# Patient Record
Sex: Male | Born: 1940 | Race: Black or African American | Hispanic: No | Marital: Single | Smoking: Current every day smoker
Health system: Southern US, Community
[De-identification: ages and names within clinical notes are randomized; demographics above are authoritative.]

## PROBLEM LIST (undated history)

## (undated) DIAGNOSIS — I10 Essential (primary) hypertension: Secondary | ICD-10-CM

## (undated) DIAGNOSIS — S065X9A Traumatic subdural hemorrhage with loss of consciousness of unspecified duration, initial encounter: Secondary | ICD-10-CM

## (undated) DIAGNOSIS — G629 Polyneuropathy, unspecified: Secondary | ICD-10-CM

## (undated) DIAGNOSIS — S065XAA Traumatic subdural hemorrhage with loss of consciousness status unknown, initial encounter: Secondary | ICD-10-CM

---

## 1997-08-23 ENCOUNTER — Emergency Department (HOSPITAL_COMMUNITY): Admission: EM | Admit: 1997-08-23 | Discharge: 1997-08-23 | Payer: Self-pay | Admitting: Emergency Medicine

## 1997-12-13 ENCOUNTER — Encounter: Admission: RE | Admit: 1997-12-13 | Discharge: 1997-12-13 | Payer: Self-pay | Admitting: Hematology and Oncology

## 1997-12-24 ENCOUNTER — Encounter: Admission: RE | Admit: 1997-12-24 | Discharge: 1997-12-24 | Payer: Self-pay | Admitting: Internal Medicine

## 1998-01-24 ENCOUNTER — Ambulatory Visit (HOSPITAL_COMMUNITY): Admission: RE | Admit: 1998-01-24 | Discharge: 1998-01-24 | Payer: Self-pay | Admitting: Internal Medicine

## 1998-01-24 ENCOUNTER — Encounter: Admission: RE | Admit: 1998-01-24 | Discharge: 1998-01-24 | Payer: Self-pay | Admitting: Internal Medicine

## 1998-04-01 ENCOUNTER — Encounter: Admission: RE | Admit: 1998-04-01 | Discharge: 1998-04-01 | Payer: Self-pay | Admitting: Internal Medicine

## 1998-07-24 ENCOUNTER — Encounter: Admission: RE | Admit: 1998-07-24 | Discharge: 1998-07-24 | Payer: Self-pay | Admitting: Hematology and Oncology

## 1999-11-12 ENCOUNTER — Encounter: Admission: RE | Admit: 1999-11-12 | Discharge: 1999-11-12 | Payer: Self-pay | Admitting: Internal Medicine

## 1999-12-08 ENCOUNTER — Encounter: Admission: RE | Admit: 1999-12-08 | Discharge: 1999-12-08 | Payer: Self-pay | Admitting: Internal Medicine

## 2009-12-06 ENCOUNTER — Emergency Department (HOSPITAL_COMMUNITY): Admission: EM | Admit: 2009-12-06 | Discharge: 2009-12-06 | Payer: Self-pay | Admitting: Emergency Medicine

## 2010-06-05 LAB — URINALYSIS, ROUTINE W REFLEX MICROSCOPIC
Glucose, UA: >1000 mg/dL — AB
Hgb urine dipstick: NEGATIVE
Ketones, ur: NEGATIVE mg/dL
Protein, ur: NEGATIVE mg/dL

## 2010-06-05 LAB — URINE MICROSCOPIC-ADD ON

## 2010-06-12 ENCOUNTER — Emergency Department (HOSPITAL_COMMUNITY)
Admission: EM | Admit: 2010-06-12 | Discharge: 2010-06-12 | Disposition: A | Discharge status: Home / Self Care | Payer: Non-veteran care | Attending: Emergency Medicine | Admitting: Emergency Medicine

## 2010-06-12 ENCOUNTER — Emergency Department (HOSPITAL_COMMUNITY): Payer: Non-veteran care

## 2010-06-12 ENCOUNTER — Encounter (HOSPITAL_COMMUNITY): Payer: Self-pay | Admitting: Radiology

## 2010-06-12 DIAGNOSIS — R1033 Periumbilical pain: Secondary | ICD-10-CM | POA: Insufficient documentation

## 2010-06-12 DIAGNOSIS — R630 Anorexia: Secondary | ICD-10-CM | POA: Insufficient documentation

## 2010-06-12 DIAGNOSIS — Z79899 Other long term (current) drug therapy: Secondary | ICD-10-CM | POA: Insufficient documentation

## 2010-06-12 DIAGNOSIS — N1330 Unspecified hydronephrosis: Secondary | ICD-10-CM | POA: Insufficient documentation

## 2010-06-12 DIAGNOSIS — E119 Type 2 diabetes mellitus without complications: Secondary | ICD-10-CM | POA: Insufficient documentation

## 2010-06-12 DIAGNOSIS — E785 Hyperlipidemia, unspecified: Secondary | ICD-10-CM | POA: Insufficient documentation

## 2010-06-12 DIAGNOSIS — I10 Essential (primary) hypertension: Secondary | ICD-10-CM | POA: Insufficient documentation

## 2010-06-12 DIAGNOSIS — R112 Nausea with vomiting, unspecified: Secondary | ICD-10-CM | POA: Insufficient documentation

## 2010-06-12 DIAGNOSIS — N201 Calculus of ureter: Secondary | ICD-10-CM | POA: Insufficient documentation

## 2010-06-12 HISTORY — DX: Essential (primary) hypertension: I10

## 2010-06-12 LAB — DIFFERENTIAL
Basophils Absolute: 0 10*3/uL (ref 0.0–0.1)
Lymphocytes Relative: 15 % (ref 12–46)
Neutro Abs: 8.6 10*3/uL — ABNORMAL HIGH (ref 1.7–7.7)

## 2010-06-12 LAB — URINALYSIS, ROUTINE W REFLEX MICROSCOPIC
Bilirubin Urine: NEGATIVE
Ketones, ur: 15 mg/dL — AB
Nitrite: NEGATIVE
pH: 7 (ref 5.0–8.0)

## 2010-06-12 LAB — COMPREHENSIVE METABOLIC PANEL
ALT: 22 U/L (ref 0–53)
Alkaline Phosphatase: 59 U/L (ref 39–117)
CO2: 25 mEq/L (ref 19–32)
GFR calc non Af Amer: >60 mL/min (ref >60)
Glucose, Bld: 256 mg/dL — ABNORMAL HIGH (ref 70–99)
Potassium: 4.1 mEq/L (ref 3.5–5.1)
Sodium: 138 mEq/L (ref 135–145)
Total Protein: 7.1 g/dL (ref 6.0–8.3)

## 2010-06-12 LAB — CBC
HCT: 37.2 % — ABNORMAL LOW (ref 39.0–52.0)
Hemoglobin: 12.5 g/dL — ABNORMAL LOW (ref 13.0–17.0)
WBC: 11.2 10*3/uL — ABNORMAL HIGH (ref 4.0–10.5)

## 2010-06-12 LAB — LIPASE, BLOOD: Lipase: 28 U/L (ref 11–59)

## 2010-06-12 LAB — URINE MICROSCOPIC-ADD ON

## 2010-08-20 ENCOUNTER — Emergency Department (HOSPITAL_COMMUNITY)
Admission: EM | Admit: 2010-08-20 | Discharge: 2010-08-20 | Disposition: A | Discharge status: Home / Self Care | Payer: Non-veteran care | Attending: Emergency Medicine | Admitting: Emergency Medicine

## 2010-08-20 DIAGNOSIS — E119 Type 2 diabetes mellitus without complications: Secondary | ICD-10-CM | POA: Insufficient documentation

## 2010-08-20 DIAGNOSIS — Z0000 Encounter for general adult medical examination without abnormal findings: Secondary | ICD-10-CM | POA: Insufficient documentation

## 2010-08-20 DIAGNOSIS — Z794 Long term (current) use of insulin: Secondary | ICD-10-CM | POA: Insufficient documentation

## 2010-08-20 DIAGNOSIS — I10 Essential (primary) hypertension: Secondary | ICD-10-CM | POA: Insufficient documentation

## 2010-08-20 LAB — GLUCOSE, CAPILLARY: Glucose-Capillary: 279 mg/dL — ABNORMAL HIGH (ref 70–99)

## 2012-08-28 ENCOUNTER — Emergency Department (HOSPITAL_COMMUNITY)
Admission: EM | Admit: 2012-08-28 | Discharge: 2012-08-28 | Discharge status: Against Medical Advice | Payer: Non-veteran care | Attending: Emergency Medicine | Admitting: Emergency Medicine

## 2012-08-28 ENCOUNTER — Encounter (HOSPITAL_COMMUNITY): Payer: Self-pay | Admitting: Emergency Medicine

## 2012-08-28 DIAGNOSIS — Z8669 Personal history of other diseases of the nervous system and sense organs: Secondary | ICD-10-CM | POA: Insufficient documentation

## 2012-08-28 DIAGNOSIS — T38801A Poisoning by unspecified hormones and synthetic substitutes, accidental (unintentional), initial encounter: Secondary | ICD-10-CM | POA: Insufficient documentation

## 2012-08-28 DIAGNOSIS — I10 Essential (primary) hypertension: Secondary | ICD-10-CM | POA: Insufficient documentation

## 2012-08-28 DIAGNOSIS — R42 Dizziness and giddiness: Secondary | ICD-10-CM | POA: Insufficient documentation

## 2012-08-28 DIAGNOSIS — E119 Type 2 diabetes mellitus without complications: Secondary | ICD-10-CM | POA: Insufficient documentation

## 2012-08-28 DIAGNOSIS — K089 Disorder of teeth and supporting structures, unspecified: Secondary | ICD-10-CM | POA: Insufficient documentation

## 2012-08-28 DIAGNOSIS — Z79899 Other long term (current) drug therapy: Secondary | ICD-10-CM | POA: Insufficient documentation

## 2012-08-28 DIAGNOSIS — Z794 Long term (current) use of insulin: Secondary | ICD-10-CM | POA: Insufficient documentation

## 2012-08-28 DIAGNOSIS — T383X1A Poisoning by insulin and oral hypoglycemic [antidiabetic] drugs, accidental (unintentional), initial encounter: Secondary | ICD-10-CM | POA: Insufficient documentation

## 2012-08-28 DIAGNOSIS — F17200 Nicotine dependence, unspecified, uncomplicated: Secondary | ICD-10-CM | POA: Insufficient documentation

## 2012-08-28 DIAGNOSIS — Y929 Unspecified place or not applicable: Secondary | ICD-10-CM | POA: Insufficient documentation

## 2012-08-28 DIAGNOSIS — Y9389 Activity, other specified: Secondary | ICD-10-CM | POA: Insufficient documentation

## 2012-08-28 HISTORY — DX: Polyneuropathy, unspecified: G62.9

## 2012-08-28 LAB — POCT I-STAT, CHEM 8
Calcium, Ion: 1.21 mmol/L (ref 1.13–1.30)
Glucose, Bld: 149 mg/dL — ABNORMAL HIGH (ref 70–99)
HCT: 41 % (ref 39.0–52.0)
Hemoglobin: 13.9 g/dL (ref 13.0–17.0)
Potassium: 3.5 mEq/L (ref 3.5–5.1)
TCO2: 26 mmol/L (ref 0–100)

## 2012-08-28 LAB — GLUCOSE, CAPILLARY
Glucose-Capillary: 283 mg/dL — ABNORMAL HIGH (ref 70–99)
Glucose-Capillary: 169 mg/dL — ABNORMAL HIGH (ref 70–99)

## 2012-08-28 MED ORDER — ASPIRIN 325 MG PO TABS
650.0000 mg | ORAL_TABLET | Freq: Once | ORAL | Status: AC
  Administered 2012-08-28: 650 mg via ORAL
  Filled 2012-08-28: qty 2

## 2012-08-28 NOTE — ED Notes (Signed)
Pt gave himself 44 units of fast acting novlin insulin this was a mistake he was supposed to give himself 12 units according to his sliding scale. His blood sugar at home was 344. EMS CBG was 322 at approximatly 1300. Pt alert and oriented. No signs or symptoms of low blood sugar at this time.

## 2012-08-28 NOTE — ED Notes (Signed)
[id number]<AV> Expected date:<BR> Expected time:<BR> Means of arrival:<BR> Comments:<BR> Accident overdose on insulin took 44 units instead of 12 cbg is 300.

## 2012-08-28 NOTE — ED Notes (Signed)
cbg 163  

## 2012-08-28 NOTE — ED Notes (Signed)
cbg 150. 

## 2012-08-28 NOTE — ED Notes (Signed)
Offered pt something to eat, but pt refused. Pt states he is concerned about how he will get a ride home and wants to leave. MD aware.

## 2012-08-28 NOTE — ED Notes (Signed)
CBG 283 

## 2012-08-28 NOTE — ED Provider Notes (Signed)
History     CSN: [id number]  Arrival date & time 08/28/12  1311   First MD Initiated Contact with Patient 08/28/12 1315      Chief Complaint  Patient presents with  . Drug Overdose    (Consider location/radiation/quality/duration/timing/severity/associated sxs/prior treatment) HPI Comments: Patient presents with an overdose of his insulin. He has history of diabetes and takes metformin at home. He also takes Lantus 44 units in the morning although he doesn't always take that. He did not take his Lantus today. He says that his sugar was over 300 at home today. He states that when his sugar gets over 300 he takes Novolin 12 units. He states that he accidentally took 44 units of his Novolin instead of the 12 units. He currently feels fine and has no symptoms. He has some mild dizziness but he feels like it's from his wisdom teeth that are painful and have to be extracted soon. He denies any other recent illnesses.   Past Medical History  Diagnosis Date  . Diabetes mellitus   . Hypertension   . Neuropathy     History reviewed. No pertinent past surgical history.  History reviewed. No pertinent family history.  History  Substance Use Topics  . Smoking status: Current Every Day Smoker  . Smokeless tobacco: Not on file  . Alcohol Use: Yes      Review of Systems  Constitutional: Negative for fever, chills, diaphoresis and fatigue.  HENT: Negative for congestion, rhinorrhea and sneezing.   Eyes: Negative.   Respiratory: Negative for cough, chest tightness and shortness of breath.   Cardiovascular: Negative for chest pain and leg swelling.  Gastrointestinal: Negative for nausea, vomiting, abdominal pain, diarrhea and blood in stool.  Genitourinary: Negative for frequency, hematuria, flank pain and difficulty urinating.  Musculoskeletal: Negative for back pain and arthralgias.  Skin: Negative for rash.  Neurological: Positive for light-headedness. Negative for dizziness, speech  difficulty, weakness, numbness and headaches.    Allergies  Review of patient's allergies indicates no known allergies.  Home Medications   Current Outpatient Rx  Name  Route  Sig  Dispense  Refill  . insulin glargine (LANTUS) 100 UNIT/ML injection   Subcutaneous   Inject 44 Units into the skin 2 (two) times daily. Only takes if CBG > 185         . insulin regular (NOVOLIN R,HUMULIN R) 100 units/mL injection   Subcutaneous   Inject 12 Units into the skin 3 (three) times daily before meals. Only takes if CBG > 300.         . metFORMIN (GLUCOPHAGE) 1000 MG tablet   Oral   Take 1,000 mg by mouth 2 (two) times daily with a meal.           BP 127/55  Pulse 66  Temp(Src) 98.2 F (36.8 C) (Oral)  Resp 22  SpO2 99%  Physical Exam  Constitutional: He is oriented to person, place, and time. He appears well-developed and well-nourished.  HENT:  Head: Normocephalic and atraumatic.  Mouth/Throat: Oropharynx is clear and moist.  Eyes: Pupils are equal, round, and reactive to light.  Neck: Normal range of motion. Neck supple.  Cardiovascular: Normal rate, regular rhythm and normal heart sounds.   Pulmonary/Chest: Effort normal and breath sounds normal. No respiratory distress. He has no wheezes. He has no rales. He exhibits no tenderness.  Abdominal: Soft. Bowel sounds are normal. There is no tenderness. There is no rebound and no guarding.  Musculoskeletal: Normal range of  motion. He exhibits no edema.  Lymphadenopathy:    He has no cervical adenopathy.  Neurological: He is alert and oriented to person, place, and time.  Skin: Skin is warm and dry. No rash noted.  Psychiatric: He has a normal mood and affect.    ED Course  Procedures (including critical care time)  Results for orders placed during the hospital encounter of 08/28/12  GLUCOSE, CAPILLARY      Result Value Range   Glucose-Capillary 283 (*) 70 - 99 mg/dL  GLUCOSE, CAPILLARY      Result Value Range    Glucose-Capillary 169 (*) 70 - 99 mg/dL  GLUCOSE, CAPILLARY      Result Value Range   Glucose-Capillary 150 (*) 70 - 99 mg/dL  POCT I-STAT, CHEM 8      Result Value Range   Sodium 141  135 - 145 mEq/L   Potassium 3.5  3.5 - 5.1 mEq/L   Chloride 105  96 - 112 mEq/L   BUN 12  6 - 23 mg/dL   Creatinine, Ser 5.40  0.50 - 1.35 mg/dL   Glucose, Bld 981 (*) 70 - 99 mg/dL   Calcium, Ion 1.91  4.78 - 1.30 mmol/L   TCO2 26  0 - 100 mmol/L   Hemoglobin 13.9  13.0 - 17.0 g/dL   HCT 29.5  62.1 - 30.8 %   No results found.   1. Insulin overdose, initial encounter       MDM  Plan is to monitor blood sugar, feed patient.  PT will need a few hours of observation to make sure that CBGs are stable.  Will turn over to Dr Radford Pax        Rolan Bucco, MD 08/28/12 1540

## 2012-08-28 NOTE — ED Notes (Signed)
Lab tech at bedside to draw i-stat chem 8.

## 2012-08-28 NOTE — ED Notes (Signed)
Pt resting quietly. No complaints at this time. Pt states he feels fine. VSS.

## 2012-08-28 NOTE — ED Notes (Signed)
CBG - 85 mg/dl 

## 2012-08-28 NOTE — ED Notes (Signed)
Pt states he wants to leave and take care of his blood sugar. Pt offered food and orange juice, but initially pt refused. Dr. Radford Pax aware. Dr. Radford Pax speaking with pt and is aware that pt wants to leave AMA.

## 2013-09-06 ENCOUNTER — Encounter (HOSPITAL_COMMUNITY): Payer: Self-pay | Admitting: Emergency Medicine

## 2013-09-06 ENCOUNTER — Emergency Department (HOSPITAL_COMMUNITY): Payer: Medicare Other

## 2013-09-06 ENCOUNTER — Emergency Department (HOSPITAL_COMMUNITY)
Admission: EM | Admit: 2013-09-06 | Discharge: 2013-09-06 | Disposition: A | Discharge status: Home / Self Care | Payer: Medicare Other | Attending: Emergency Medicine | Admitting: Emergency Medicine

## 2013-09-06 DIAGNOSIS — S02839A Fracture of medial orbital wall, unspecified side, initial encounter for closed fracture: Secondary | ICD-10-CM

## 2013-09-06 DIAGNOSIS — I10 Essential (primary) hypertension: Secondary | ICD-10-CM | POA: Insufficient documentation

## 2013-09-06 DIAGNOSIS — F17200 Nicotine dependence, unspecified, uncomplicated: Secondary | ICD-10-CM | POA: Insufficient documentation

## 2013-09-06 DIAGNOSIS — Z8669 Personal history of other diseases of the nervous system and sense organs: Secondary | ICD-10-CM | POA: Insufficient documentation

## 2013-09-06 DIAGNOSIS — Z7982 Long term (current) use of aspirin: Secondary | ICD-10-CM | POA: Insufficient documentation

## 2013-09-06 DIAGNOSIS — E119 Type 2 diabetes mellitus without complications: Secondary | ICD-10-CM | POA: Insufficient documentation

## 2013-09-06 DIAGNOSIS — S0230XA Fracture of orbital floor, unspecified side, initial encounter for closed fracture: Secondary | ICD-10-CM | POA: Insufficient documentation

## 2013-09-06 DIAGNOSIS — S0232XA Fracture of orbital floor, left side, initial encounter for closed fracture: Secondary | ICD-10-CM

## 2013-09-06 DIAGNOSIS — Z794 Long term (current) use of insulin: Secondary | ICD-10-CM | POA: Insufficient documentation

## 2013-09-06 DIAGNOSIS — Z79899 Other long term (current) drug therapy: Secondary | ICD-10-CM | POA: Insufficient documentation

## 2013-09-06 LAB — I-STAT CHEM 8, ED
BUN: 12 mg/dL (ref 6–23)
CHLORIDE: 105 meq/L (ref 96–112)
Calcium, Ion: 1.2 mmol/L (ref 1.13–1.30)
Creatinine, Ser: 0.7 mg/dL (ref 0.50–1.35)
Glucose, Bld: 197 mg/dL — ABNORMAL HIGH (ref 70–99)
HEMATOCRIT: 48 % (ref 39.0–52.0)
Hemoglobin: 16.3 g/dL (ref 13.0–17.0)
POTASSIUM: 3.4 meq/L — AB (ref 3.7–5.3)
Sodium: 139 mEq/L (ref 137–147)
TCO2: 25 mmol/L (ref 0–100)

## 2013-09-06 MED ORDER — IOHEXOL 350 MG/ML SOLN
50.0000 mL | Freq: Once | INTRAVENOUS | Status: AC | PRN
  Administered 2013-09-06: 50 mL via INTRAVENOUS

## 2013-09-06 MED ORDER — ONDANSETRON 4 MG PO TBDP
4.0000 mg | ORAL_TABLET | Freq: Once | ORAL | Status: AC
  Administered 2013-09-06: 4 mg via ORAL
  Filled 2013-09-06: qty 1

## 2013-09-06 MED ORDER — ONDANSETRON 4 MG PO TBDP: 4.0000 mg | ORAL_TABLET | Freq: Four times a day (QID) | ORAL | Status: DC | PRN

## 2013-09-06 MED ORDER — CEPHALEXIN 500 MG PO CAPS: 500.0000 mg | ORAL_CAPSULE | Freq: Two times a day (BID) | ORAL | Status: DC

## 2013-09-06 MED ORDER — OXYCODONE-ACETAMINOPHEN 5-325 MG PO TABS: 1.0000 | ORAL_TABLET | ORAL | Status: DC | PRN

## 2013-09-06 MED ORDER — SODIUM CHLORIDE 0.9 % IV BOLUS (SEPSIS)
1000.0000 mL | Freq: Once | INTRAVENOUS | Status: AC
  Administered 2013-09-06: 1000 mL via INTRAVENOUS

## 2013-09-06 MED ORDER — OXYCODONE-ACETAMINOPHEN 5-325 MG PO TABS
1.0000 | ORAL_TABLET | Freq: Once | ORAL | Status: AC
  Administered 2013-09-06: 1 via ORAL
  Filled 2013-09-06: qty 1
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
## 2013-09-06 NOTE — Discharge Instructions (Signed)
Do not blow your nose for the next 2 weeks. Apply ice to the affected swollen areas. If you have any worsening symptoms return to the ER for evaluation. Otherwise call tomorrow for your 2 appointments. Is important to followup with the high doctor tomorrow. You're to follow up with the ear nose and throat doctor in 7-10 days.   Head Injury You have received a head injury. It does not appear serious at this time. Headaches and vomiting are common following head injury. It should be easy to awaken from sleeping. Sometimes it is necessary for you to stay in the emergency department for a while for observation. Sometimes admission to the hospital may be needed. After injuries such as yours, most problems occur within the first 24 hours, but side effects may occur up to 7-10 days after the injury. It is important for you to carefully monitor your condition and contact your health care provider or seek immediate medical care if there is a change in your condition. WHAT ARE THE TYPES OF HEAD INJURIES? Head injuries can be as minor as a bump. Some head injuries can be more severe. More severe head injuries include:  A jarring injury to the brain (concussion).  A bruise of the brain (contusion). This mean there is bleeding in the brain that can cause swelling.  A cracked skull (skull fracture).  Bleeding in the brain that collects, clots, and forms a bump (hematoma). WHAT CAUSES A HEAD INJURY? A serious head injury is most likely to happen to someone who is in a car wreck and is not wearing a seat belt. Other causes of major head injuries include bicycle or motorcycle accidents, sports injuries, and falls. HOW ARE HEAD INJURIES DIAGNOSED? A complete history of the event leading to the injury and your current symptoms will be helpful in diagnosing head injuries. Many times, pictures of the brain, such as CT or MRI are needed to see the extent of the injury. Often, an overnight hospital stay is necessary for  observation.  WHEN SHOULD I SEEK IMMEDIATE MEDICAL CARE?  You should get help right away if:  You have confusion or drowsiness.  You feel sick to your stomach (nauseous) or have continued, forceful vomiting.  You have dizziness or unsteadiness that is getting worse.  You have severe, continued headaches not relieved by medicine. Only take over-the-counter or prescription medicines for pain, fever, or discomfort as directed by your health care provider.  You do not have normal function of the arms or legs or are unable to walk.  You notice changes in the black spots in the center of the colored part of your eye (pupil).  You have a clear or bloody fluid coming from your nose or ears.  You have a loss of vision. During the next 24 hours after the injury, you must stay with someone who can watch you for the warning signs. This person should contact local emergency services (911 in the [place] if you have seizures, you become unconscious, or you are unable to wake up. HOW CAN I PREVENT A HEAD INJURY IN THE FUTURE? The most important factor for preventing major head injuries is avoiding motor vehicle accidents. To minimize the potential for damage to your head, it is crucial to wear seat belts while riding in motor vehicles. Wearing helmets while bike riding and playing collision sports (like football) is also helpful. Also, avoiding dangerous activities around the house will further help reduce your risk of head injury.  WHEN CAN  I RETURN TO NORMAL ACTIVITIES AND ATHLETICS? You should be reevaluated by your health care provider before returning to these activities. If you have any of the following symptoms, you should not return to activities or contact sports until 1 week after the symptoms have stopped:  Persistent headache.  Dizziness or vertigo.  Poor attention and concentration.  Confusion.  Memory problems.  Nausea or vomiting.  Fatigue or tire  easily.  Irritability.  Intolerant of bright lights or loud noises.  Anxiety or depression.  Disturbed sleep. MAKE SURE YOU:   Understand these instructions.  Will watch your condition.  Will get help right away if you are not doing well or get worse. Document Released: 03/09/2005 Document Revised: 03/14/2013 Document Reviewed: 11/14/2012 [facility] Patient Information 2015 [place], [place]. This information is not intended to replace advice given to you by your health care provider. Make sure you discuss any questions you have with your health care provider.   Metformin and X-ray Contrast Studies For some X-ray exams, a contrast dye is used. Contrast dye is a type of medicine used to make the X-ray image clearer. The contrast dye is given to the patient through a vein (intravenously). If you need to have this type of X-ray exam and you take a medication called metformin, your caregiver may have you stop taking metformin before the exam.  LACTIC ACIDOSIS In rare cases, a serious medical condition called lactic acidosis can develop in people who take metformin and receive contrast dye. The following conditions can increase the risk of this complication:   Kidney failure.  Liver problems.  Certain types of heart problems such as:  Heart failure.  Heart attack.  Heart infection.  Heart valve problems.  Alcohol abuse. If left untreated, lactic acidosis can lead to coma.  SYMPTOMS OF LACTIC ACIDOSIS Symptoms of lactic acidosis can include:  Rapid breathing (hyperventilation).  Neurologic symptoms such as:  Headaches.  Confusion.  Dizziness.  Excessive sweating.  Feeling sick to your stomach (nauseous) or throwing up (vomiting). AFTER THE X-RAY EXAM  Stay well-hydrated. Drink fluids as instructed by your caregiver.  If you have a risk of developing lactic acidosis, blood tests may be done to make sure your kidney function is okay.  Metformin is usually stopped  for 48 hours after the X-ray exam. Ask your caregiver when you can start taking metformin again. SEEK MEDICAL CARE IF:   You have shortness of breath or difficulty breathing.  You develop a headache that does not go away.  You have nausea or vomiting.  You urinate more than normal.  You develop a skin rash and have:  Redness.  Swelling.  Itching. Document Released: 02/25/2009 Document Revised: 06/01/2011 Document Reviewed: 02/25/2009 [facility] Patient Information 2015 [place], [place]. This information is not intended to replace advice given to you by your health care provider. Make sure you discuss any questions you have with your health care provider.

## 2013-09-06 NOTE — ED Notes (Signed)
Checked patient eyes patient was 20/70 in both eyes patient was 20/50 right eye patient was 20/100 in left eyes patient does wear glasses
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
## 2013-09-20 ENCOUNTER — Emergency Department (HOSPITAL_COMMUNITY)
Admission: EM | Admit: 2013-09-20 | Discharge: 2013-09-20 | Disposition: A | Discharge status: Home / Self Care | Payer: Non-veteran care | Attending: Emergency Medicine | Admitting: Emergency Medicine

## 2013-09-20 ENCOUNTER — Emergency Department (HOSPITAL_COMMUNITY): Payer: Non-veteran care

## 2013-09-20 ENCOUNTER — Encounter (HOSPITAL_COMMUNITY): Payer: Self-pay | Admitting: Emergency Medicine

## 2013-09-20 DIAGNOSIS — F17200 Nicotine dependence, unspecified, uncomplicated: Secondary | ICD-10-CM | POA: Insufficient documentation

## 2013-09-20 DIAGNOSIS — E119 Type 2 diabetes mellitus without complications: Secondary | ICD-10-CM | POA: Insufficient documentation

## 2013-09-20 DIAGNOSIS — IMO0001 Reserved for inherently not codable concepts without codable children: Secondary | ICD-10-CM | POA: Insufficient documentation

## 2013-09-20 DIAGNOSIS — Z794 Long term (current) use of insulin: Secondary | ICD-10-CM | POA: Insufficient documentation

## 2013-09-20 DIAGNOSIS — Z792 Long term (current) use of antibiotics: Secondary | ICD-10-CM | POA: Insufficient documentation

## 2013-09-20 DIAGNOSIS — I10 Essential (primary) hypertension: Secondary | ICD-10-CM | POA: Insufficient documentation

## 2013-09-20 DIAGNOSIS — R079 Chest pain, unspecified: Secondary | ICD-10-CM | POA: Insufficient documentation

## 2013-09-20 DIAGNOSIS — Z79899 Other long term (current) drug therapy: Secondary | ICD-10-CM | POA: Insufficient documentation

## 2013-09-20 DIAGNOSIS — J0100 Acute maxillary sinusitis, unspecified: Secondary | ICD-10-CM | POA: Insufficient documentation

## 2013-09-20 MED ORDER — AMOXICILLIN-POT CLAVULANATE 875-125 MG PO TABS: 1.0000 | ORAL_TABLET | Freq: Two times a day (BID) | ORAL | Status: DC

## 2013-09-20 MED ORDER — LORATADINE 10 MG PO TABS: 10.0000 mg | ORAL_TABLET | Freq: Every day | ORAL | Status: DC
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
## 2013-09-20 NOTE — ED Notes (Signed)
Patient transported to X-ray 

## 2013-09-20 NOTE — Discharge Instructions (Signed)
Sinusitis Sinusitis is redness, soreness, and swelling (inflammation) of the paranasal sinuses. Paranasal sinuses are air pockets within the bones of your face (beneath the eyes, the middle of the forehead, or above the eyes). In healthy paranasal sinuses, mucus is able to drain out, and air is able to circulate through them by way of your nose. However, when your paranasal sinuses are inflamed, mucus and air can become trapped. This can allow bacteria and other germs to grow and cause infection. Sinusitis can develop quickly and last only a short time (acute) or continue over a long period (chronic). Sinusitis that lasts for more than 12 weeks is considered chronic.  CAUSES  Causes of sinusitis include:  Allergies.  Structural abnormalities, such as displacement of the cartilage that separates your nostrils (deviated septum), which can decrease the air flow through your nose and sinuses and affect sinus drainage.  Functional abnormalities, such as when the small hairs (cilia) that line your sinuses and help remove mucus do not work properly or are not present. SYMPTOMS  Symptoms of acute and chronic sinusitis are the same. The primary symptoms are pain and pressure around the affected sinuses. Other symptoms include:  Upper toothache.  Earache.  Headache.  Bad breath.  Decreased sense of smell and taste.  A cough, which worsens when you are lying flat.  Fatigue.  Fever.  Thick drainage from your nose, which often is green and may contain pus (purulent).  Swelling and warmth over the affected sinuses. DIAGNOSIS  Your caregiver will perform a physical exam. During the exam, your caregiver may:  Look in your nose for signs of abnormal growths in your nostrils (nasal polyps).  Tap over the affected sinus to check for signs of infection.  View the inside of your sinuses (endoscopy) with a special imaging device with a light attached (endoscope), which is inserted into your  sinuses. If your caregiver suspects that you have chronic sinusitis, one or more of the following tests may be recommended:  Allergy tests.  Nasal culture--A sample of mucus is taken from your nose and sent to a lab and screened for bacteria.  Nasal cytology--A sample of mucus is taken from your nose and examined by your caregiver to determine if your sinusitis is related to an allergy. TREATMENT  Most cases of acute sinusitis are related to a viral infection and will resolve on their own within 10 days. Sometimes medicines are prescribed to help relieve symptoms (pain medicine, decongestants, nasal steroid sprays, or saline sprays).  However, for sinusitis related to a bacterial infection, your caregiver will prescribe antibiotic medicines. These are medicines that will help kill the bacteria causing the infection.  Rarely, sinusitis is caused by a fungal infection. In theses cases, your caregiver will prescribe antifungal medicine. For some cases of chronic sinusitis, surgery is needed. Generally, these are cases in which sinusitis recurs more than 3 times per year, despite other treatments. HOME CARE INSTRUCTIONS   Drink plenty of water. Water helps thin the mucus so your sinuses can drain more easily.  Use a humidifier.  Inhale steam 3 to 4 times a day (for example, sit in the bathroom with the shower running).  Apply a warm, moist washcloth to your face 3 to 4 times a day, or as directed by your caregiver.  Use saline nasal sprays to help moisten and clean your sinuses.  Take over-the-counter or prescription medicines for pain, discomfort, or fever only as directed by your caregiver. SEEK IMMEDIATE MEDICAL CARE IF:    You have increasing pain or severe headaches.  You have nausea, vomiting, or drowsiness.  You have swelling around your face.  You have vision problems.  You have a stiff neck.  You have difficulty breathing. MAKE SURE YOU:   Understand these  instructions.  Will watch your condition.  Will get help right away if you are not doing well or get worse. Document Released: 03/09/2005 Document Revised: 06/01/2011 Document Reviewed: 03/24/2011 Exit[employer] Patient Information 2015 [employer]. This information is not intended to replace advice given to you by your health care provider. Make sure you discuss any questions you have with your health care provider.  
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
## 2013-10-14 ENCOUNTER — Encounter (HOSPITAL_COMMUNITY): Payer: Self-pay | Admitting: Emergency Medicine

## 2013-10-14 ENCOUNTER — Emergency Department (HOSPITAL_COMMUNITY): Payer: Medicare Other

## 2013-10-14 ENCOUNTER — Inpatient Hospital Stay (HOSPITAL_COMMUNITY)
Admission: EM | Admit: 2013-10-14 | Discharge: 2013-10-30 | DRG: 026 | Disposition: A | Discharge status: Skilled Nursing Facility | Payer: Medicare Other | Attending: Neurosurgery | Admitting: Neurosurgery

## 2013-10-14 DIAGNOSIS — S065X9A Traumatic subdural hemorrhage with loss of consciousness of unspecified duration, initial encounter: Principal | ICD-10-CM | POA: Diagnosis present

## 2013-10-14 DIAGNOSIS — I10 Essential (primary) hypertension: Secondary | ICD-10-CM | POA: Diagnosis present

## 2013-10-14 DIAGNOSIS — Z91018 Allergy to other foods: Secondary | ICD-10-CM | POA: Diagnosis not present

## 2013-10-14 DIAGNOSIS — E876 Hypokalemia: Secondary | ICD-10-CM | POA: Diagnosis not present

## 2013-10-14 DIAGNOSIS — F1010 Alcohol abuse, uncomplicated: Secondary | ICD-10-CM | POA: Diagnosis present

## 2013-10-14 DIAGNOSIS — E119 Type 2 diabetes mellitus without complications: Secondary | ICD-10-CM | POA: Diagnosis present

## 2013-10-14 DIAGNOSIS — G589 Mononeuropathy, unspecified: Secondary | ICD-10-CM | POA: Diagnosis present

## 2013-10-14 DIAGNOSIS — G8190 Hemiplegia, unspecified affecting unspecified side: Secondary | ICD-10-CM | POA: Diagnosis present

## 2013-10-14 DIAGNOSIS — R29810 Facial weakness: Secondary | ICD-10-CM | POA: Diagnosis present

## 2013-10-14 DIAGNOSIS — R4182 Altered mental status, unspecified: Secondary | ICD-10-CM | POA: Diagnosis present

## 2013-10-14 DIAGNOSIS — Z9181 History of falling: Secondary | ICD-10-CM | POA: Diagnosis not present

## 2013-10-14 DIAGNOSIS — Z7982 Long term (current) use of aspirin: Secondary | ICD-10-CM | POA: Diagnosis not present

## 2013-10-14 DIAGNOSIS — F17200 Nicotine dependence, unspecified, uncomplicated: Secondary | ICD-10-CM | POA: Diagnosis present

## 2013-10-14 DIAGNOSIS — Z794 Long term (current) use of insulin: Secondary | ICD-10-CM | POA: Diagnosis not present

## 2013-10-14 DIAGNOSIS — S065XAA Traumatic subdural hemorrhage with loss of consciousness status unknown, initial encounter: Principal | ICD-10-CM | POA: Diagnosis present

## 2013-10-14 LAB — COMPREHENSIVE METABOLIC PANEL
ALBUMIN: 3.4 g/dL — AB (ref 3.5–5.2)
ALT: 13 U/L (ref 0–53)
ANION GAP: 15 (ref 5–15)
AST: 22 U/L (ref 0–37)
Alkaline Phosphatase: 72 U/L (ref 39–117)
BILIRUBIN TOTAL: 0.8 mg/dL (ref 0.3–1.2)
BUN: 9 mg/dL (ref 6–23)
CO2: 23 meq/L (ref 19–32)
CREATININE: 0.74 mg/dL (ref 0.50–1.35)
Calcium: 8.8 mg/dL (ref 8.4–10.5)
Chloride: 102 mEq/L (ref 96–112)
GFR calc Af Amer: >90 mL/min (ref >90)
GFR calc non Af Amer: 89 mL/min — ABNORMAL LOW (ref >90)
Glucose, Bld: 156 mg/dL — ABNORMAL HIGH (ref 70–99)
Potassium: 3.5 mEq/L — ABNORMAL LOW (ref 3.7–5.3)
Sodium: 140 mEq/L (ref 137–147)
Total Protein: 6.6 g/dL (ref 6.0–8.3)

## 2013-10-14 LAB — I-STAT CHEM 8, ED
BUN: 8 mg/dL (ref 6–23)
CALCIUM ION: 1.14 mmol/L (ref 1.13–1.30)
Chloride: 103 mEq/L (ref 96–112)
Creatinine, Ser: 0.8 mg/dL (ref 0.50–1.35)
Glucose, Bld: 159 mg/dL — ABNORMAL HIGH (ref 70–99)
HCT: 44 % (ref 39.0–52.0)
HEMOGLOBIN: 15 g/dL (ref 13.0–17.0)
Potassium: 3.4 mEq/L — ABNORMAL LOW (ref 3.7–5.3)
Sodium: 140 mEq/L (ref 137–147)
TCO2: 22 mmol/L (ref 0–100)

## 2013-10-14 LAB — CBC
HEMATOCRIT: 41.6 % (ref 39.0–52.0)
HEMOGLOBIN: 13.4 g/dL (ref 13.0–17.0)
MCH: 27.6 pg (ref 26.0–34.0)
MCHC: 32.2 g/dL (ref 30.0–36.0)
MCV: 85.8 fL (ref 78.0–100.0)
Platelets: 257 10*3/uL (ref 150–400)
RBC: 4.85 MIL/uL (ref 4.22–5.81)
RDW: 14.5 % (ref 11.5–15.5)
WBC: 6.6 10*3/uL (ref 4.0–10.5)

## 2013-10-14 LAB — DIFFERENTIAL
BASOS PCT: 0 % (ref 0–1)
Basophils Absolute: 0 10*3/uL (ref 0.0–0.1)
Eosinophils Absolute: 0.2 10*3/uL (ref 0.0–0.7)
Eosinophils Relative: 3 % (ref 0–5)
Lymphocytes Relative: 32 % (ref 12–46)
Lymphs Abs: 2.1 10*3/uL (ref 0.7–4.0)
MONO ABS: 0.7 10*3/uL (ref 0.1–1.0)
MONOS PCT: 10 % (ref 3–12)
NEUTROS ABS: 3.6 10*3/uL (ref 1.7–7.7)
Neutrophils Relative %: 55 % (ref 43–77)

## 2013-10-14 LAB — ETHANOL: Alcohol, Ethyl (B): <11 mg/dL (ref 0–11)

## 2013-10-14 LAB — APTT: APTT: 28 s (ref 24–37)

## 2013-10-14 LAB — PROTIME-INR
INR: 1.03 (ref 0.00–1.49)
Prothrombin Time: 13.5 seconds (ref 11.6–15.2)

## 2013-10-14 LAB — I-STAT TROPONIN, ED: TROPONIN I, POC: 0.01 ng/mL (ref 0.00–0.08)

## 2013-10-14 MED ORDER — LORATADINE 10 MG PO TABS
10.0000 mg | ORAL_TABLET | Freq: Every day | ORAL | Status: DC
Start: 2013-10-15 — End: 2013-10-30
  Administered 2013-10-16 – 2013-10-30 (×14): 10 mg via ORAL
  Filled 2013-10-14 (×16): qty 1

## 2013-10-14 MED ORDER — ACETAMINOPHEN 650 MG RE SUPP: 650.0000 mg | RECTAL | Status: DC | PRN

## 2013-10-14 MED ORDER — INSULIN GLARGINE 100 UNIT/ML ~~LOC~~ SOLN
44.0000 [IU] | Freq: Two times a day (BID) | SUBCUTANEOUS | Status: DC
  Administered 2013-10-16 – 2013-10-17 (×2): 44 [IU] via SUBCUTANEOUS
  Filled 2013-10-14 (×6): qty 0.44

## 2013-10-14 MED ORDER — LABETALOL HCL 5 MG/ML IV SOLN: 10.0000 mg | INTRAVENOUS | Status: DC | PRN

## 2013-10-14 MED ORDER — PANTOPRAZOLE SODIUM 40 MG IV SOLR
40.0000 mg | Freq: Every day | INTRAVENOUS | Status: DC
  Administered 2013-10-15 – 2013-10-16 (×3): 40 mg via INTRAVENOUS
  Filled 2013-10-14 (×4): qty 40

## 2013-10-14 MED ORDER — SENNOSIDES-DOCUSATE SODIUM 8.6-50 MG PO TABS
1.0000 | ORAL_TABLET | Freq: Two times a day (BID) | ORAL | Status: DC
  Administered 2013-10-15 – 2013-10-30 (×29): 1 via ORAL
  Filled 2013-10-14 (×32): qty 1

## 2013-10-14 MED ORDER — STROKE: EARLY STAGES OF RECOVERY BOOK
Freq: Once | Status: AC
  Administered 2013-10-15: 01:00:00
  Filled 2013-10-14: qty 1

## 2013-10-14 MED ORDER — METFORMIN HCL 500 MG PO TABS
1000.0000 mg | ORAL_TABLET | Freq: Two times a day (BID) | ORAL | Status: DC
  Administered 2013-10-16 (×2): 1000 mg via ORAL
  Filled 2013-10-14 (×7): qty 2

## 2013-10-14 MED ORDER — ACETAMINOPHEN-CODEINE #3 300-30 MG PO TABS
1.0000 | ORAL_TABLET | ORAL | Status: DC | PRN
  Administered 2013-10-21: 1 via ORAL
  Filled 2013-10-14: qty 1

## 2013-10-14 MED ORDER — INSULIN ASPART 100 UNIT/ML ~~LOC~~ SOLN
0.0000 [IU] | Freq: Three times a day (TID) | SUBCUTANEOUS | Status: DC
  Administered 2013-10-15 (×2): 2 [IU] via SUBCUTANEOUS
  Administered 2013-10-16 (×2): 3 [IU] via SUBCUTANEOUS
  Administered 2013-10-17: 2 [IU] via SUBCUTANEOUS

## 2013-10-14 MED ORDER — ONDANSETRON 4 MG PO TBDP
4.0000 mg | ORAL_TABLET | Freq: Four times a day (QID) | ORAL | Status: DC | PRN
  Administered 2013-10-15 – 2013-10-29 (×4): 4 mg via ORAL
  Filled 2013-10-14 (×6): qty 1

## 2013-10-14 MED ORDER — ACETAMINOPHEN 325 MG PO TABS
650.0000 mg | ORAL_TABLET | ORAL | Status: DC | PRN
  Administered 2013-10-16 – 2013-10-26 (×4): 650 mg via ORAL
  Filled 2013-10-14 (×4): qty 2

## 2013-10-14 MED ORDER — SODIUM CHLORIDE 0.9 % IV SOLN
INTRAVENOUS | Status: DC
  Administered 2013-10-15 – 2013-10-17 (×4): via INTRAVENOUS
  Administered 2013-10-17 – 2013-10-18 (×2): 100 mL/h via INTRAVENOUS
  Administered 2013-10-19 – 2013-10-20 (×3): via INTRAVENOUS
  Administered 2013-10-20: 100 mL/h via INTRAVENOUS
  Administered 2013-10-21 – 2013-10-24 (×5): via INTRAVENOUS
  Administered 2013-10-24: 100 mL/h via INTRAVENOUS
  Administered 2013-10-25 – 2013-10-28 (×8): via INTRAVENOUS
  Administered 2013-10-29: 1000 mL via INTRAVENOUS
  Administered 2013-10-30: 05:00:00 via INTRAVENOUS
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
## 2013-10-14 NOTE — ED Notes (Signed)
Report given to Sheila, RN
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
## 2013-10-14 NOTE — ED Notes (Signed)
Dr. Belfi at bedside 

## 2013-10-14 NOTE — ED Notes (Signed)
Patient transported to CT 

## 2013-10-15 ENCOUNTER — Encounter (HOSPITAL_COMMUNITY): Payer: Medicare Other | Admitting: Anesthesiology

## 2013-10-15 ENCOUNTER — Inpatient Hospital Stay (HOSPITAL_COMMUNITY): Payer: Medicare Other | Admitting: Anesthesiology

## 2013-10-15 ENCOUNTER — Encounter (HOSPITAL_COMMUNITY)
Admission: EM | Disposition: A | Discharge status: Skilled Nursing Facility | Payer: Self-pay | Source: Home / Self Care | Attending: Neurosurgery

## 2013-10-15 HISTORY — PX: CRANIOTOMY: SHX93

## 2013-10-15 LAB — GLUCOSE, CAPILLARY
GLUCOSE-CAPILLARY: 140 mg/dL — AB (ref 70–99)
GLUCOSE-CAPILLARY: 137 mg/dL — AB (ref 70–99)
Glucose-Capillary: 120 mg/dL — ABNORMAL HIGH (ref 70–99)
Glucose-Capillary: 140 mg/dL — ABNORMAL HIGH (ref 70–99)

## 2013-10-15 LAB — POCT I-STAT 4, (NA,K, GLUC, HGB,HCT)
GLUCOSE: 103 mg/dL — AB (ref 70–99)
HCT: 39 % (ref 39.0–52.0)
Hemoglobin: 13.3 g/dL (ref 13.0–17.0)
Potassium: 3 mEq/L — ABNORMAL LOW (ref 3.7–5.3)
SODIUM: 140 meq/L (ref 137–147)

## 2013-10-15 LAB — MRSA PCR SCREENING: MRSA BY PCR: NEGATIVE

## 2013-10-15 SURGERY — CRANIOTOMY HEMATOMA EVACUATION SUBDURAL
Anesthesia: General | Site: Head | Laterality: Left

## 2013-10-15 MED ORDER — NEOSTIGMINE METHYLSULFATE 10 MG/10ML IV SOLN
INTRAVENOUS | Status: DC | PRN
  Administered 2013-10-15: 2 mg via INTRAVENOUS

## 2013-10-15 MED ORDER — OXYCODONE HCL 5 MG PO TABS: 5.0000 mg | ORAL_TABLET | Freq: Once | ORAL | Status: DC | PRN

## 2013-10-15 MED ORDER — LIDOCAINE HCL (CARDIAC) 20 MG/ML IV SOLN
INTRAVENOUS | Status: DC | PRN
  Administered 2013-10-15: 100 mg via INTRAVENOUS

## 2013-10-15 MED ORDER — NEOSTIGMINE METHYLSULFATE 10 MG/10ML IV SOLN
INTRAVENOUS | Status: AC
  Filled 2013-10-15: qty 1

## 2013-10-15 MED ORDER — SUCCINYLCHOLINE CHLORIDE 20 MG/ML IJ SOLN
INTRAMUSCULAR | Status: AC
  Filled 2013-10-15: qty 1

## 2013-10-15 MED ORDER — ONDANSETRON HCL 4 MG/2ML IJ SOLN
INTRAMUSCULAR | Status: AC
  Filled 2013-10-15: qty 2

## 2013-10-15 MED ORDER — 0.9 % SODIUM CHLORIDE (POUR BTL) OPTIME
TOPICAL | Status: DC | PRN
  Administered 2013-10-15 (×3): 1000 mL

## 2013-10-15 MED ORDER — SODIUM CHLORIDE 0.9 % IV SOLN
INTRAVENOUS | Status: DC | PRN
  Administered 2013-10-15: 12:00:00 via INTRAVENOUS

## 2013-10-15 MED ORDER — OXYCODONE-ACETAMINOPHEN 5-325 MG PO TABS: 1.0000 | ORAL_TABLET | ORAL | Status: DC | PRN

## 2013-10-15 MED ORDER — HYDROMORPHONE HCL PF 1 MG/ML IJ SOLN: 0.2500 mg | INTRAMUSCULAR | Status: DC | PRN

## 2013-10-15 MED ORDER — FENTANYL CITRATE 0.05 MG/ML IJ SOLN
INTRAMUSCULAR | Status: AC
  Filled 2013-10-15: qty 5

## 2013-10-15 MED ORDER — PROPOFOL 10 MG/ML IV BOLUS
INTRAVENOUS | Status: DC | PRN
  Administered 2013-10-15: 100 mg via INTRAVENOUS

## 2013-10-15 MED ORDER — LIDOCAINE-EPINEPHRINE 1 %-1:100000 IJ SOLN
INTRAMUSCULAR | Status: DC | PRN
  Administered 2013-10-15: 10 mL

## 2013-10-15 MED ORDER — ROCURONIUM BROMIDE 100 MG/10ML IV SOLN
INTRAVENOUS | Status: DC | PRN
  Administered 2013-10-15: 50 mg via INTRAVENOUS

## 2013-10-15 MED ORDER — LIDOCAINE HCL (CARDIAC) 20 MG/ML IV SOLN
INTRAVENOUS | Status: AC
  Filled 2013-10-15: qty 5

## 2013-10-15 MED ORDER — HYDRALAZINE HCL 20 MG/ML IJ SOLN
5.0000 mg | INTRAMUSCULAR | Status: DC | PRN
  Administered 2013-10-15: 10 mg via INTRAVENOUS
  Administered 2013-10-18: 20 mg via INTRAVENOUS
  Administered 2013-10-20 – 2013-10-26 (×5): 10 mg via INTRAVENOUS
  Filled 2013-10-15 (×7): qty 1

## 2013-10-15 MED ORDER — HEMOSTATIC AGENTS (NO CHARGE) OPTIME
TOPICAL | Status: DC | PRN
  Administered 2013-10-15: 1 via TOPICAL

## 2013-10-15 MED ORDER — CEFAZOLIN SODIUM-DEXTROSE 2-3 GM-% IV SOLR
2.0000 g | Freq: Once | INTRAVENOUS | Status: AC
  Administered 2013-10-15: 2 g via INTRAVENOUS

## 2013-10-15 MED ORDER — PHENYLEPHRINE 40 MCG/ML (10ML) SYRINGE FOR IV PUSH (FOR BLOOD PRESSURE SUPPORT)
PREFILLED_SYRINGE | INTRAVENOUS | Status: AC
  Filled 2013-10-15: qty 10

## 2013-10-15 MED ORDER — FENTANYL CITRATE 0.05 MG/ML IJ SOLN
INTRAMUSCULAR | Status: DC | PRN
  Administered 2013-10-15 (×2): 50 ug via INTRAVENOUS
  Administered 2013-10-15: 250 ug via INTRAVENOUS
  Administered 2013-10-15: 50 ug via INTRAVENOUS

## 2013-10-15 MED ORDER — SODIUM CHLORIDE 0.9 % IJ SOLN
INTRAMUSCULAR | Status: AC
  Filled 2013-10-15: qty 10

## 2013-10-15 MED ORDER — LABETALOL HCL 5 MG/ML IV SOLN
INTRAVENOUS | Status: DC | PRN
  Administered 2013-10-15: 10 mg via INTRAVENOUS

## 2013-10-15 MED ORDER — BACITRACIN 50000 UNITS IM SOLR
INTRAMUSCULAR | Status: DC | PRN
  Administered 2013-10-15: 14:00:00

## 2013-10-15 MED ORDER — PHENYLEPHRINE HCL 10 MG/ML IJ SOLN
10.0000 mg | INTRAVENOUS | Status: DC | PRN
  Administered 2013-10-15: 40 ug/min via INTRAVENOUS

## 2013-10-15 MED ORDER — EPHEDRINE SULFATE 50 MG/ML IJ SOLN
INTRAMUSCULAR | Status: AC
  Filled 2013-10-15: qty 1

## 2013-10-15 MED ORDER — PROPOFOL 10 MG/ML IV BOLUS
INTRAVENOUS | Status: AC
  Filled 2013-10-15: qty 20

## 2013-10-15 MED ORDER — BUPIVACAINE HCL (PF) 0.5 % IJ SOLN
INTRAMUSCULAR | Status: DC | PRN
  Administered 2013-10-15: 10 mL

## 2013-10-15 MED ORDER — THROMBIN 20000 UNITS EX SOLR
CUTANEOUS | Status: DC | PRN
  Administered 2013-10-15: 14:00:00 via TOPICAL

## 2013-10-15 MED ORDER — GLYCOPYRROLATE 0.2 MG/ML IJ SOLN
INTRAMUSCULAR | Status: DC | PRN
  Administered 2013-10-15: 0.4 mg via INTRAVENOUS

## 2013-10-15 MED ORDER — ONDANSETRON HCL 4 MG/2ML IJ SOLN
INTRAMUSCULAR | Status: DC | PRN
  Administered 2013-10-15: 4 mg via INTRAVENOUS

## 2013-10-15 MED ORDER — MORPHINE SULFATE 2 MG/ML IJ SOLN
2.0000 mg | INTRAMUSCULAR | Status: DC | PRN
  Administered 2013-10-20: 2 mg via INTRAVENOUS
  Filled 2013-10-15: qty 1

## 2013-10-15 MED ORDER — ROCURONIUM BROMIDE 50 MG/5ML IV SOLN
INTRAVENOUS | Status: AC
  Filled 2013-10-15: qty 1

## 2013-10-15 MED ORDER — MEPERIDINE HCL 25 MG/ML IJ SOLN: 6.2500 mg | INTRAMUSCULAR | Status: DC | PRN

## 2013-10-15 MED ORDER — GLYCOPYRROLATE 0.2 MG/ML IJ SOLN
INTRAMUSCULAR | Status: AC
  Filled 2013-10-15: qty 2

## 2013-10-15 MED ORDER — OXYCODONE HCL 5 MG/5ML PO SOLN: 5.0000 mg | Freq: Once | ORAL | Status: DC | PRN

## 2013-10-15 MED ORDER — BACITRACIN ZINC 500 UNIT/GM EX OINT
TOPICAL_OINTMENT | CUTANEOUS | Status: DC | PRN
  Administered 2013-10-15: 1 via TOPICAL

## 2013-10-15 MED ORDER — MORPHINE SULFATE 4 MG/ML IJ SOLN: 4.0000 mg | INTRAMUSCULAR | Status: DC | PRN

## 2013-10-15 MED ORDER — THROMBIN 5000 UNITS EX SOLR
OROMUCOSAL | Status: DC | PRN
  Administered 2013-10-15: 14:00:00 via TOPICAL

## 2013-10-15 MED ORDER — ONDANSETRON HCL 4 MG/2ML IJ SOLN
4.0000 mg | Freq: Once | INTRAMUSCULAR | Status: DC | PRN
  Filled 2013-10-15: qty 2

## 2013-10-15 SURGICAL SUPPLY — 56 items
BANDAGE GAUZE 4  KLING STR (GAUZE/BANDAGES/DRESSINGS) ×4 IMPLANT
BLADE CLIPPER SURG (BLADE) ×2 IMPLANT
BNDG GAUZE ELAST 4 BULKY (GAUZE/BANDAGES/DRESSINGS) ×2 IMPLANT
BRUSH SCRUB EZ PLAIN DRY (MISCELLANEOUS) ×2 IMPLANT
BUR ACORN 6.0 ACORN (BURR) ×1 IMPLANT
BUR ACORN 6.0MM ACORN (BURR) ×1
BUR ROUTER D-58 CRANI (BURR) ×2 IMPLANT
CANISTER SUCT 3000ML (MISCELLANEOUS) ×4 IMPLANT
CONT SPEC 4OZ CLIKSEAL STRL BL (MISCELLANEOUS) ×2 IMPLANT
CORDS BIPOLAR (ELECTRODE) ×2 IMPLANT
COVER LIGHT HANDLE STERIS (MISCELLANEOUS) ×4 IMPLANT
DRAIN SNY WOU 7FLT (WOUND CARE) ×2 IMPLANT
DRAPE NEUROLOGICAL W/INCISE (DRAPES) ×2 IMPLANT
DRAPE WARM FLUID 44X44 (DRAPE) ×2 IMPLANT
DRSG TUBE GAUZE 5/8 (GAUZE/BANDAGES/DRESSINGS) ×2 IMPLANT
DURAPREP 26ML APPLICATOR (WOUND CARE) ×2 IMPLANT
ELECT REM PT RETURN 9FT ADLT (ELECTROSURGICAL) ×3
ELECTRODE REM PT RTRN 9FT ADLT (ELECTROSURGICAL) IMPLANT
EVACUATOR SILICONE 100CC (DRAIN) ×2 IMPLANT
FORCEPS BIPOLAR SPETZLER 8 1.0 (NEUROSURGERY SUPPLIES) ×2 IMPLANT
GLOVE BIO SURGEON STRL SZ7 (GLOVE) ×4 IMPLANT
GLOVE BIOGEL PI IND STRL 7.5 (GLOVE) IMPLANT
GLOVE BIOGEL PI INDICATOR 7.5 (GLOVE) ×4
GLOVE ECLIPSE 7.5 STRL STRAW (GLOVE) ×2 IMPLANT
GLOVE EXAM NITRILE MICROT MD (GLOVE) ×4 IMPLANT
GOWN STRL REUS W/ TWL LRG LVL3 (GOWN DISPOSABLE) IMPLANT
GOWN STRL REUS W/TWL LRG LVL3 (GOWN DISPOSABLE) ×6
HEMOSTAT POWDER KIT SURGIFOAM (HEMOSTASIS) ×2 IMPLANT
HEMOSTAT SURGICEL 2X14 (HEMOSTASIS) ×2 IMPLANT
KIT BASIN OR (CUSTOM PROCEDURE TRAY) ×2 IMPLANT
KIT ROOM TURNOVER OR (KITS) ×2 IMPLANT
MARKER SKIN DUAL TIP RULER LAB (MISCELLANEOUS) ×2 IMPLANT
NDL HYPO 25GX1X1/2 BEV (NEEDLE) IMPLANT
NEEDLE HYPO 25GX1X1/2 BEV (NEEDLE) ×3 IMPLANT
NS IRRIG 1000ML POUR BTL (IV SOLUTION) ×6 IMPLANT
PACK CRANIOTOMY (CUSTOM PROCEDURE TRAY) ×2 IMPLANT
PAD ARMBOARD 7.5X6 YLW CONV (MISCELLANEOUS) ×6 IMPLANT
PENCIL BUTTON HOLSTER BLD 10FT (ELECTRODE) ×2 IMPLANT
PIN MAYFIELD SKULL DISP (PIN) ×2 IMPLANT
PLATE 1.5  2HOLE LNG NEURO (Plate) ×8 IMPLANT
PLATE 1.5 2HOLE LNG NEURO (Plate) IMPLANT
SCREW SELF DRILL HT 1.5/4MM (Screw) ×16 IMPLANT
SCRUB BETADINE 4OZ XXX (MISCELLANEOUS) ×2 IMPLANT
SPONGE GAUZE 4X4 12PLY STER LF (GAUZE/BANDAGES/DRESSINGS) ×2 IMPLANT
STAPLER SKIN PROX WIDE 3.9 (STAPLE) ×2 IMPLANT
STAPLER VISISTAT 35W (STAPLE) ×2 IMPLANT
SUT VIC AB 0 CT1 18XCR BRD 8 (SUTURE) IMPLANT
SUT VIC AB 0 CT1 8-18 (SUTURE) ×3
SUT VIC AB 3-0 SH 8-18 (SUTURE) ×2 IMPLANT
SUT VICRYL 3-0 RB1 18 ABS (SUTURE) ×8 IMPLANT
SYR 20ML ECCENTRIC (SYRINGE) ×2 IMPLANT
TAPE CLOTH 1X10 TAN NS (GAUZE/BANDAGES/DRESSINGS) ×2 IMPLANT
TOWEL OR 17X24 6PK STRL BLUE (TOWEL DISPOSABLE) ×2 IMPLANT
TOWEL OR 17X26 4PK STRL BLUE (TOWEL DISPOSABLE) ×2 IMPLANT
TRAY FOLEY CATH 16FRSI W/METER (SET/KITS/TRAYS/PACK) ×2 IMPLANT
WATER STERILE IRR 1000ML POUR (IV SOLUTION) ×2 IMPLANT

## 2013-10-15 NOTE — Progress Notes (Signed)
Pt seen and examined. No issues overnight. Pt has no new c/o, minimal HA.  EXAM: Temp:  [97.8 F (36.6 C)-98.1 F (36.7 C)] 97.8 F (36.6 C) (07/26 0813) Pulse Rate:  [44-62] 44 (07/26 0700) Resp:  [13-20] 16 (07/26 0700) BP: (142-173)/(55-79) 156/71 mmHg (07/26 0700) SpO2:  [97 %-100 %] 100 % (07/26 0000) Weight:  [57.9 kg (127 lb 10.3 oz)-61.236 kg (135 lb)] 57.9 kg (127 lb 10.3 oz) (07/26 0000) Intake/Output     07/25 0701 - 07/26 0700 07/26 0701 - 07/27 0700   I.V. (mL/kg) 700 (12.1) 100 (1.7)   Total Intake(mL/kg) 700 (12.1) 100 (1.7)   Net +700 +100         Awake, alert, oriented Speech fluent CN grossly intact x slight right facial droop Mild RUE/RLE drift  LABS: Lab Results  Component Value Date   CREATININE 0.80 10/14/2013   BUN 8 10/14/2013   NA 140 10/14/2013   K 3.4* 10/14/2013   CL 103 10/14/2013   CO2 23 10/14/2013   Lab Results  Component Value Date   WBC 6.6 10/14/2013   HGB 15.0 10/14/2013   HCT 44.0 10/14/2013   MCV 85.8 10/14/2013   PLT 257 10/14/2013    IMPRESSION: - 73 y.o. male with large left subacute SDH with mass effect and mild right hemiparesis - Neurologically stable  PLAN: - OR today for left craniotomy for evacuation of SDH  Spoke with patient again about surgery. All his questions were answered.

## 2013-10-15 NOTE — Anesthesia Preprocedure Evaluation (Addendum)
Anesthesia Evaluation  Patient identified by MRN, date of birth, ID band Patient awake    Reviewed: Allergy & Precautions, H&P , NPO status , Patient's Chart, lab work & pertinent test results  Airway Mallampati: I TM Distance: >3 FB Neck ROM: Full    Dental  (+) Missing, Teeth Intact   Pulmonary Current Smoker,          Cardiovascular hypertension, Pt. on medications     Neuro/Psych    GI/Hepatic   Endo/Other  diabetes, Type 2, Oral Hypoglycemic Agents  Renal/GU      Musculoskeletal   Abdominal   Peds  Hematology   Anesthesia Other Findings   Reproductive/Obstetrics                         Anesthesia Physical Anesthesia Plan  ASA: III and emergent  Anesthesia Plan: General   Post-op Pain Management:    Induction: Intravenous  Airway Management Planned: Oral ETT  Additional Equipment: Arterial line  Intra-op Plan:   Post-operative Plan: Extubation in OR  Informed Consent: I have reviewed the patients History and Physical, chart, labs and discussed the procedure including the risks, benefits and alternatives for the proposed anesthesia with the patient or authorized representative who has indicated his/her understanding and acceptance.   Dental advisory given  Plan Discussed with: CRNA, Surgeon and Anesthesiologist  Anesthesia Plan Comments:        Anesthesia Quick Evaluation

## 2013-10-15 NOTE — Transfer of Care (Signed)
Immediate Anesthesia Transfer of Care Note  Patient: Arthur ChuJames E Cegielski  Procedure(s) Performed: Procedure(s): CRANIOTOMY HEMATOMA EVACUATION SUBDURAL (Left)  Patient Location: PACU  Anesthesia Type:General  Level of Consciousness: sedated  Airway & Oxygen Therapy: Patient Spontanous Breathing and Patient connected to nasal cannula oxygen  Post-op Assessment: Report given to PACU RN and Post -op Vital signs reviewed and stable  Post vital signs: Reviewed and stable  Complications: No apparent anesthesia complications

## 2013-10-15 NOTE — Anesthesia Postprocedure Evaluation (Signed)
Anesthesia Post Note  Patient: Arthur Bailey  Procedure(s) Performed: Procedure(s) (LRB): CRANIOTOMY HEMATOMA EVACUATION SUBDURAL (Left)  Anesthesia type: general  Patient location: PACU  Post pain: Pain level controlled  Post assessment: Patient's Cardiovascular Status Stable  Last Vitals:  Filed Vitals:   10/15/13 1559  BP: 141/67  Pulse: 58  Temp:   Resp: 23    Post vital signs: Reviewed and stable  Level of consciousness: sedated  Complications: No apparent anesthesia complications

## 2013-10-15 NOTE — Progress Notes (Signed)
Zofran 4 mg IV removed from Pyxis. Orders changed from IV to PO. Medication not given and wasted. Waste witnessed by [employer].Calone, Charity fundraiserN. PO medication received from Pharmacy and given. Will continue to monitor.

## 2013-10-15 NOTE — Anesthesia Procedure Notes (Signed)
Procedure Name: Intubation Date/Time: 10/15/2013 1:13 PM Performed by: Rogelia BogaMUELLER, Foye Damron P Pre-anesthesia Checklist: Patient identified, Emergency Drugs available, Suction available, Patient being monitored and Timeout performed Patient Re-evaluated:Patient Re-evaluated prior to inductionOxygen Delivery Method: Circle system utilized Preoxygenation: Pre-oxygenation with 100% oxygen Intubation Type: IV induction Ventilation: Mask ventilation without difficulty Laryngoscope Size: Mac and 4 Grade View: Grade I Tube type: Oral Tube size: 8.0 mm Number of attempts: 1 Airway Equipment and Method: Stylet Placement Confirmation: ETT inserted through vocal cords under direct vision,  positive ETCO2 and breath sounds checked- equal and bilateral Secured at: 22 cm Tube secured with: Tape Dental Injury: Teeth and Oropharynx as per pre-operative assessment

## 2013-10-15 NOTE — Op Note (Signed)
PREOP DIAGNOSIS: Left frontal subdural hematoma  POSTOP DIAGNOSIS: Same  PROCEDURE: 1. Left frontal craniotomy for evacuation of subdural hematoma  SURGEON: Dr. Lisbeth RenshawNeelesh Asiana Benninger, MD  ASSISTANT: none  ANESTHESIA: General Endotracheal  EBL: 100cc  SPECIMENS: None  DRAINS: None  COMPLICATIONS: None immediate  CONDITION: Hemodynamically stable to PACU  HISTORY: Arthur Bailey is a 73 y.o. male presenting to the emergency department with direct behavior, and found on physical exam to have mild right hemiparesis and facial droop. CT scan demonstrated a large left subacute subdural hematoma with mass effect. Treatment options were discussed in detail with the patient, including craniotomy for evacuation. The risks of the surgery were discussed. After all the patient's questions were answered, verbal and written consent was obtained and placed in the chart.  PROCEDURE IN DETAIL: After informed consent was obtained and witnessed, the patient was brought to the operating room. After induction of general anesthesia, the patient was positioned on the operative table in the supine position. All pressure points were meticulously padded. Skin incision was then marked out and prepped and draped in the usual sterile fashion.  After timeout was conducted, the skin incision was infiltrated with local anesthetic. Skin incision was then made sharply, and Bovie electrocautery was used to dissect the subcutaneous tissue and the galea was incised. Hemostasis was achieved on the skin edges with Raney clips. The skin flap was then elevated and retracted anteriorly. The temporalis muscle was incised. Bur holes were then created and a craniotomy was fashioned and elevated. Hemostasis was then achieved on the dural surface with bipolar electrocautery. The dura was then opened in cruciate fashion using the monopolar cautery.  Chronic subdural fluid was immediately encountered under mild pressure. The subdural  membrane was then cauterized with bipolar electrocautery and the superficial portion of the membrane was resected. The brain was noted to be slightly pulsatile, and the subdural space was irrigated with copious amounts of saline irrigation totaling approximately 2 L. The subdural space was then inspected in all directions, and no further subdural fluid was seen.  Good hemostasis was confirmed on the brain surface as all irrigation ran clear. The dura was then closed using a combination of interrupted and continuous 4-0 Nurolon stitches. A piece of Gelfoam was then placed over the dural surface. The bone was then plated using standard titanium plates and screws, and replaced. Muscle was then closed using interrupted 0 Vicryl stitches, and the galea was closed using interrupted 3-0 Vicryl sutures. The skin was closed using standard surgical skin staples. Sterile dressing was then applied. The patient was then transferred to the stretcher and taken to the postanesthesia care unit in stable hemodynamic condition.  At the end of the case all sponge, needle, and instrument counts were correct.

## 2013-10-16 ENCOUNTER — Inpatient Hospital Stay (HOSPITAL_COMMUNITY): Payer: Medicare Other

## 2013-10-16 LAB — GLUCOSE, CAPILLARY
GLUCOSE-CAPILLARY: 172 mg/dL — AB (ref 70–99)
GLUCOSE-CAPILLARY: 57 mg/dL — AB (ref 70–99)
GLUCOSE-CAPILLARY: 72 mg/dL (ref 70–99)
Glucose-Capillary: 160 mg/dL — ABNORMAL HIGH (ref 70–99)
Glucose-Capillary: 97 mg/dL (ref 70–99)
Glucose-Capillary: 66 mg/dL — ABNORMAL LOW (ref 70–99)
Glucose-Capillary: 114 mg/dL — ABNORMAL HIGH (ref 70–99)

## 2013-10-16 NOTE — Progress Notes (Signed)
Pt seen and examined. No issues overnight. Pt has been somewhat confused but does not report any HA, visual changes.  EXAM: Temp:  [97.6 F (36.4 C)-99.2 F (37.3 C)] 98.9 F (37.2 C) (07/27 1159) Pulse Rate:  [49-76] 58 (07/27 1100) Resp:  [10-25] 16 (07/27 1100) BP: (104-169)/(33-73) 126/53 mmHg (07/27 1100) SpO2:  [94 %-100 %] 97 % (07/27 1100) Arterial Line BP: (145-180)/(46-74) 150/51 mmHg (07/27 1000) Weight:  [60.8 kg (134 lb 0.6 oz)] 60.8 kg (134 lb 0.6 oz) (07/27 0400) Intake/Output     07/26 0701 - 07/27 0700 07/27 0701 - 07/28 0700   P.O. 420    I.V. (mL/kg) 3700 (60.9) 400 (6.6)   Total Intake(mL/kg) 4120 (67.8) 400 (6.6)   Urine (mL/kg/hr) 1815 (1.2) 175 (0.6)   Emesis/NG output 100 (0.1)    Total Output 1915 175   Net +2205 +225        Urine Occurrence 1 x     Awake, alert, oriented to person, reason for hospitalization, not year. Speech fluent Right neglect Mild right hemiparesis Good strength left Headwrap c/d/i  LABS: Lab Results  Component Value Date   CREATININE 0.80 10/14/2013   BUN 8 10/14/2013   NA 140 10/15/2013   K 3.0* 10/15/2013   CL 103 10/14/2013   CO2 23 10/14/2013   Lab Results  Component Value Date   WBC 6.6 10/14/2013   HGB 13.3 10/15/2013   HCT 39.0 10/15/2013   MCV 85.8 10/14/2013   PLT 257 10/14/2013    IMAGING: Postop CT reviewed demonstrates unchanged MLS, subacute SDH appears replaced with isodense fluid, possible CSF/Saline irrigation. Residual dependent subacute/chronic blood seen.  IMPRESSION: - 73 y.o. male s/p crani for chronic SDH, slightly worsened neurologically postop  PLAN: - Cont current supportive care - PT/OT/SLP eval - D/C foley, a-line - Cont keppra

## 2013-10-16 NOTE — Progress Notes (Signed)
UR completed.  Ciclaly Mulcahey, RN BSN MHA CCM Trauma/Neuro ICU Case Manager [phone]  

## 2013-10-16 NOTE — Evaluation (Signed)
Speech Language Pathology Evaluation Patient Details Name: Arthur Bailey MRN: [id number] DOB: July 11, 1940 Today's Date: 10/16/2013 Time: [id number] SLP Time Calculation (min): 25 min  Problem List:  Patient Active Problem List   Diagnosis Date Noted  . SDH (subdural hematoma) 10/14/2013  . Subdural hematoma 10/14/2013   Past Medical History:  Past Medical History  Diagnosis Date  . Diabetes mellitus   . Hypertension   . Neuropathy    Past Surgical History: History reviewed. No pertinent past surgical history. HPI:  Patient is a 73 y.o. male presenting with weakness and altered mental status. CT Head revealed acute to subacute left frontal subdural hematoma measuring up to 2.5 cm, with mass effect on the left frontal lobe and 7 mm left to right midline shift. Pt underwent craniotomy for evacuation 7/26.   Assessment / Plan / Recommendation Clinical Impression  Pt demonstrates mild perseveration and difficulty with complex linguistic tasks, which is likely exacerbated by more pronounced cognitive impairments in the areas of orientation, awareness, sustained attention, right inattention, and basic problem solving. Speech is mild-moderately dysarthric due to imprecise articulation and decreased vocal intensity, and improves with Min-Mod cues for use of speech strategies. Pt will benefit from SLP services to maximize functional communication and cognition.    SLP Assessment  Patient needs continued Speech Lanaguage Pathology Services    Follow Up Recommendations  Inpatient Rehab;24 hour supervision/assistance (PT/OT evals pending)    Frequency and Duration min 2x/week  2 weeks   Pertinent Vitals/Pain n/a   SLP Goals  SLP Goals Potential to Achieve Goals: Good Potential Considerations: Ability to learn/carryover information  SLP Evaluation Prior Functioning  Cognitive/Linguistic Baseline: Information not available   Cognition  Overall Cognitive Status: Impaired/Different  from baseline Arousal/Alertness: Lethargic Orientation Level: Oriented to place;Disoriented to person;Disoriented to time;Disoriented to situation Attention: Sustained Sustained Attention: Impaired Sustained Attention Impairment: Verbal basic;Functional basic Memory: Impaired Memory Impairment: Decreased recall of new information Awareness: Impaired Awareness Impairment: Intellectual impairment;Emergent impairment;Anticipatory impairment Problem Solving: Impaired Problem Solving Impairment: Verbal basic;Functional basic Behaviors: Perseveration Safety/Judgment: Impaired    Comprehension  Auditory Comprehension Overall Auditory Comprehension: Impaired Yes/No Questions: Impaired Basic Biographical Questions: 76-100% accurate Basic Immediate Environment Questions: 75-100% accurate Complex Questions: 50-74% accurate Commands: Impaired One Step Basic Commands: 75-100% accurate Two Step Basic Commands: 50-74% accurate Multistep Basic Commands: 50-74% accurate Conversation: Simple Interfering Components: Attention;Working Chief Strategy Officermemory;Processing speed Reading Comprehension Reading Status: Not tested    Expression Expression Primary Mode of Expression: Verbal Verbal Expression Overall Verbal Expression: Impaired Initiation: No impairment Automatic Speech: Name;Social Response Level of Generative/Spontaneous Verbalization: Sentence Naming: Impairment Confrontation: Within functional limits Divergent: 25-49% accurate Verbal Errors: Perseveration;Not aware of errors Interfering Components: Attention Non-Verbal Means of Communication: Not applicable Written Expression Written Expression: Not tested   Oral / Motor Motor Speech Overall Motor Speech: Impaired Respiration: Within functional limits Phonation: Low vocal intensity Resonance: Within functional limits Articulation: Impaired Level of Impairment: Sentence Intelligibility: Intelligibility reduced Sentence: 50-74%  accurate Motor Planning: Witnin functional limits Motor Speech Errors: Not applicable   GO      Maxcine HamLaura Paiewonsky, M.A. CCC-SLP [phone]  Maxcine Hamaiewonsky, Lexus Shampine 10/16/2013, 5:04 PM

## 2013-10-16 NOTE — Progress Notes (Signed)
Pt's CBG was 57 at 2155.  Pt with no complaints and ate 75% of his dinner earlier tonight.  Immediate repeat CBG was 66.  Pt given 4 oz orange juice at 2200.  Repeat CBG was 72 at 2215.  Pt given another 4 oz orange juice at 2220.  Repeat CBG was 114 at 2235.  MD Wynetta Emery(Cram) notified and ordered to hold tonight's dose of lantus insulin.  Will continue to monitor.

## 2013-10-17 ENCOUNTER — Encounter (HOSPITAL_COMMUNITY): Payer: Self-pay | Admitting: Neurosurgery

## 2013-10-17 DIAGNOSIS — S069X9A Unspecified intracranial injury with loss of consciousness of unspecified duration, initial encounter: Secondary | ICD-10-CM

## 2013-10-17 DIAGNOSIS — S069XAA Unspecified intracranial injury with loss of consciousness status unknown, initial encounter: Secondary | ICD-10-CM

## 2013-10-17 DIAGNOSIS — W19XXXA Unspecified fall, initial encounter: Secondary | ICD-10-CM

## 2013-10-17 LAB — GLUCOSE, CAPILLARY
GLUCOSE-CAPILLARY: 116 mg/dL — AB (ref 70–99)
Glucose-Capillary: 121 mg/dL — ABNORMAL HIGH (ref 70–99)
Glucose-Capillary: 115 mg/dL — ABNORMAL HIGH (ref 70–99)
Glucose-Capillary: 128 mg/dL — ABNORMAL HIGH (ref 70–99)

## 2013-10-17 MED ORDER — INSULIN GLARGINE 100 UNIT/ML ~~LOC~~ SOLN
44.0000 [IU] | Freq: Every day | SUBCUTANEOUS | Status: DC
  Filled 2013-10-17: qty 0.44

## 2013-10-17 MED ORDER — METFORMIN HCL 500 MG PO TABS
750.0000 mg | ORAL_TABLET | Freq: Two times a day (BID) | ORAL | Status: DC
  Filled 2013-10-17 (×2): qty 2

## 2013-10-17 MED ORDER — INSULIN ASPART 100 UNIT/ML ~~LOC~~ SOLN
0.0000 [IU] | Freq: Three times a day (TID) | SUBCUTANEOUS | Status: DC
  Administered 2013-10-18: 3 [IU] via SUBCUTANEOUS
  Administered 2013-10-19 – 2013-10-20 (×2): 2 [IU] via SUBCUTANEOUS
  Administered 2013-10-20: 1 [IU] via SUBCUTANEOUS
  Administered 2013-10-21: 5 [IU] via SUBCUTANEOUS
  Administered 2013-10-21: 1 [IU] via SUBCUTANEOUS
  Administered 2013-10-21 – 2013-10-24 (×8): 2 [IU] via SUBCUTANEOUS
  Administered 2013-10-25: 1 [IU] via SUBCUTANEOUS
  Administered 2013-10-25: 2 [IU] via SUBCUTANEOUS
  Administered 2013-10-25: 3 [IU] via SUBCUTANEOUS
  Administered 2013-10-26: 1 [IU] via SUBCUTANEOUS
  Administered 2013-10-26: 3 [IU] via SUBCUTANEOUS
  Administered 2013-10-26 – 2013-10-27 (×4): 2 [IU] via SUBCUTANEOUS
  Administered 2013-10-28 (×2): 1 [IU] via SUBCUTANEOUS
  Administered 2013-10-28 – 2013-10-30 (×5): 2 [IU] via SUBCUTANEOUS

## 2013-10-17 MED ORDER — PANTOPRAZOLE SODIUM 40 MG PO TBEC
40.0000 mg | DELAYED_RELEASE_TABLET | Freq: Every day | ORAL | Status: DC
  Administered 2013-10-17 – 2013-10-29 (×13): 40 mg via ORAL
  Filled 2013-10-17 (×13): qty 1

## 2013-10-17 NOTE — Progress Notes (Signed)
Pt seen and examined. No issues overnight. Pt continues to deny any complaints, no HA or visual changes  EXAM: Temp:  [98.3 F (36.8 C)-99.8 F (37.7 C)] 98.6 F (37 C) (07/28 0744) Pulse Rate:  [57-85] 60 (07/28 0800) Resp:  [10-30] 22 (07/28 0800) BP: (125-158)/(46-95) 147/55 mmHg (07/28 0800) SpO2:  [95 %-99 %] 97 % (07/28 0800) Arterial Line BP: (150)/(51) 150/51 mmHg (07/27 1000) Weight:  [60.7 kg (133 lb 13.1 oz)] 60.7 kg (133 lb 13.1 oz) (07/28 0400) Intake/Output     07/27 0701 - 07/28 0700 07/28 0701 - 07/29 0700   P.O. 480    I.V. (mL/kg) 2400 (39.5) 100 (1.6)   Total Intake(mL/kg) 2880 (47.4) 100 (1.6)   Urine (mL/kg/hr) 2250 (1.5)    Emesis/NG output     Total Output 2250     Net +630 +100        Urine Occurrence 1 x     Awake, alert, oriented to person, place, reason for hospitalization. Says year is 2012 Does not track to the right Good strength LUE/LLE Increased tone RUE/RLE Significant neglect of right Headwrap in place, c/d/i   LABS: Lab Results  Component Value Date   CREATININE 0.80 10/14/2013   BUN 8 10/14/2013   NA 140 10/15/2013   K 3.0* 10/15/2013   CL 103 10/14/2013   CO2 23 10/14/2013   Lab Results  Component Value Date   WBC 6.6 10/14/2013   HGB 13.3 10/15/2013   HCT 39.0 10/15/2013   MCV 85.8 10/14/2013   PLT 257 10/14/2013   IMPRESSION: - 73 y.o. male POD#2 s/p left crani for chronic SDH with significant postoperative right-sided neglect - Postop CT shows similar local mass effect with some persistent dependent chronic subdural hematoma over left parietal lobe. Similar MLS.  PLAN: - I do not believe repeat craniotomy at this time would be of benefit as the prior surgery has replaced the majority of chronic subdural with isodense fluid without any significant re-expansion of the brain. - Cont supportive care - Decrease metformin to 750mg  BID - PT/OT today, cont SLP therapy

## 2013-10-17 NOTE — Evaluation (Signed)
Physical Therapy Evaluation Patient Details Name: Arthur Bailey MRN: [id number] DOB: 27-Aug-1940 Today's Date: 10/17/2013   History of Present Illness  Patient is a 73 y.o. male presenting with weakness and altered mental status. CT Head revealed acute to subacute left frontal subdural hematoma measuring up to 2.5 cm, with mass effect on the left frontal lobe and 7 mm left to right midline shift. Pt underwent craniotomy for evacuation 7/26.    Clinical Impression  Patient demonstrates deficits in mobility as indicated below. Will need continued skilled PT to address deficits and maximize function. Will see as indicated and progress as tolerated. Recommend CIR upon acute discharge.    Follow Up Recommendations CIR;Supervision/Assistance - 24 hour    Equipment Recommendations   (tbd)    Recommendations for Other Services Rehab consult     Precautions / Restrictions Precautions Precautions: Fall      Mobility  Bed Mobility Overal bed mobility: Needs Assistance;+2 for physical assistance Bed Mobility: Supine to Sit     Supine to sit: Max assist;+2 for physical assistance        Transfers Overall transfer level: Needs assistance Equipment used:  (face to face with gait belt and chuck pad) Transfers: Sit to/from Stand;Stand Pivot Transfers Sit to Stand: Max assist;+2 physical assistance Stand pivot transfers: Max assist;+2 physical assistance       General transfer comment: Patient required assist to elevate to standing, cues for upright posture, patient unable to initiate strides to chair but could take some weight through BLEs  Ambulation/Gait                Stairs            Wheelchair Mobility    Modified Rankin (Stroke Patients Only)       Balance Overall balance assessment: Needs assistance Sitting-balance support: Single extremity supported;Feet supported Sitting balance-Leahy Scale: Poor Sitting balance - Comments: moderate assist  required Postural control: Posterior lean;Right lateral lean Standing balance support: Bilateral upper extremity supported;During functional activity Standing balance-Leahy Scale: Zero                               Pertinent Vitals/Pain No pain indicated    Home Living Family/patient expects to be discharged to:: Private residence Living Arrangements: Alone Available Help at Discharge: Neighbor Type of Home: Apartment Home Access: Stairs to enter Entrance Stairs-Rails: Doctor, general practice of Steps: 5 Home Layout: One level Home Equipment: None      Prior Function Level of Independence: Independent               Hand Dominance   Dominant Hand: Right    Extremity/Trunk Assessment   Upper Extremity Assessment: Defer to OT evaluation;RUE deficits/detail           Lower Extremity Assessment: Generalized weakness;RLE deficits/detail RLE Deficits / Details: significant spasticity noted on evaluation, poor attention to right, some active movement with automatic commands, but unable to perform mvoements in isolation with commands       Communication      Cognition Arousal/Alertness: Awake/alert Behavior During Therapy: WFL for tasks assessed/performed;Flat affect Overall Cognitive Status: Impaired/Different from baseline Area of Impairment: Orientation;Attention;Following commands;Safety/judgement;Awareness;Problem solving Orientation Level: Disoriented to;Time;Situation Current Attention Level: Focused Memory: Decreased short-term memory Following Commands: Follows one step commands inconsistently;Follows one step commands with increased time Safety/Judgement: Decreased awareness of safety;Decreased awareness of deficits Awareness: Intellectual Problem Solving: Slow processing;Decreased initiation;Difficulty sequencing;Requires verbal cues;Requires tactile  cues General Comments: patient demonstrates some neglect and poor awareness to the  right, patient also with inconsitent ability to follow simple commands with L/R discrimination difficulty.    General Comments      Exercises        Assessment/Plan    PT Assessment Patient needs continued PT services  PT Diagnosis Difficulty walking;Abnormality of gait;Generalized weakness;Altered mental status   PT Problem List Decreased strength;Decreased range of motion;Decreased activity tolerance;Decreased balance;Decreased mobility;Decreased coordination;Decreased cognition;Decreased knowledge of use of DME;Decreased safety awareness;Impaired tone  PT Treatment Interventions DME instruction;Gait training;Stair training;Functional mobility training;Therapeutic activities;Therapeutic exercise;Balance training;Patient/family education   PT Goals (Current goals can be found in the Care Plan section) Acute Rehab PT Goals Patient Stated Goal: to go home PT Goal Formulation: With patient Time For Goal Achievement: 10/31/13 Potential to Achieve Goals: Fair    Frequency Min 3X/week   Barriers to discharge Inaccessible home environment;Decreased caregiver support      Co-evaluation               End of Session Equipment Utilized During Treatment: Gait belt Activity Tolerance: Patient tolerated treatment well;Patient limited by fatigue Patient left: in chair;with call bell/phone within reach Nurse Communication: Mobility status         Time: [id number] PT Time Calculation (min): 25 min   Charges:   PT Evaluation $Initial PT Evaluation Tier I: 1 Procedure PT Treatments $Therapeutic Activity: 23-37 mins   PT G CodesFabio Asa:          Kaileb Monsanto J 10/17/2013, 4:34 PM Charlotte Crumbevon Chavon Lucarelli, PT DPT  [phone]
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
## 2013-10-17 NOTE — Progress Notes (Signed)
Inpatient Diabetes Program Recommendations  AACE/ADA: New Consensus Statement on Inpatient Glycemic Control (2013)  Target Ranges:  Prepandial:   less than 140 mg/dL      Peak postprandial:   less than 180 mg/dL (1-2 hours)      Critically ill patients:  140 - 180 mg/dL  Results for Arthur Bailey, Roe E (MRN [id number]) as of 10/17/2013 12:33  Ref. Range 10/16/2013 21:55 10/16/2013 21:56 10/16/2013 22:17 10/16/2013 22:36 10/17/2013 07:44 10/17/2013 12:18  Glucose-Capillary Latest Range: 70-99 mg/dL 57 (L) 66 (L) 72 409114 (H) 121 (H) 115 (H)   Inpatient Diabetes Program Recommendations Insulin - Basal: Lantus 44 units once daily (home dose is 44 units BID per med rec) Correction (SSI): decrease to sensitive scale TID Oral Agents: DC Metformin during hospitalization  This coordinator spoke with RN caring for patient concerning diabetes regimen.  RN is concerned about hypoglycemia patient experienced last evening.  Above recommendations were given to RN to request from MD.  Will follow. Thank you  Piedad ClimesGina Jewel Venditto BSN, RN,CDE Inpatient Diabetes Coordinator [phone] (team pager)

## 2013-10-17 NOTE — Progress Notes (Signed)
OT Cancellation Note  Patient Details Name: Arthur Bailey MRN: [id number] DOB: January 24, 1941   Cancelled Treatment:    Reason Eval/Treat Not Completed: Patient not medically ready. Patient on bedrest.      Galen ManilaSpencer, Marlo Goodrich Jeanette 10/17/2013, 9:47 AM

## 2013-10-17 NOTE — Progress Notes (Signed)
PT Cancellation Note  Patient Details Name: Encarnacion ChuJames E Norgaard MRN: [id number] DOB: 1940-05-30   Cancelled Treatment:    Reason Eval/Treat Not Completed: Patient not medically ready. Patient on bedrest.   Fabio AsaWerner, Jearldean Gutt J 10/17/2013, 8:23 AM Charlotte Crumbevon Azrielle Springsteen, PT DPT  [phone]

## 2013-10-18 LAB — GLUCOSE, CAPILLARY
Glucose-Capillary: 107 mg/dL — ABNORMAL HIGH (ref 70–99)
Glucose-Capillary: 228 mg/dL — ABNORMAL HIGH (ref 70–99)
Glucose-Capillary: 52 mg/dL — ABNORMAL LOW (ref 70–99)
Glucose-Capillary: 56 mg/dL — ABNORMAL LOW (ref 70–99)
Glucose-Capillary: 82 mg/dL (ref 70–99)
Glucose-Capillary: 92 mg/dL (ref 70–99)
Glucose-Capillary: 99 mg/dL (ref 70–99)

## 2013-10-18 MED ORDER — INSULIN GLARGINE 100 UNIT/ML ~~LOC~~ SOLN
35.0000 [IU] | Freq: Every day | SUBCUTANEOUS | Status: DC
  Administered 2013-10-18: 35 [IU] via SUBCUTANEOUS
  Filled 2013-10-18 (×2): qty 0.35

## 2013-10-18 MED ORDER — INSULIN GLARGINE 100 UNIT/ML ~~LOC~~ SOLN: 35.0000 [IU] | Freq: Every day | SUBCUTANEOUS | Status: DC

## 2013-10-18 NOTE — Progress Notes (Signed)
Pt seen and examined. No issues overnight. Pt denies any complaints.  EXAM: Temp:  [97.9 F (36.6 C)-99.6 F (37.6 C)] 97.9 F (36.6 C) (07/29 1637) Pulse Rate:  [53-81] 74 (07/29 1300) Resp:  [19-32] 24 (07/29 1300) BP: (126-181)/(48-94) 163/76 mmHg (07/29 1300) SpO2:  [92 %-98 %] 97 % (07/29 1300) Intake/Output     07/28 0701 - 07/29 0700 07/29 0701 - 07/30 0700   P.O. 600    I.V. (mL/kg) 2400 (39.5) 600 (9.9)   Total Intake(mL/kg) 3000 (49.4) 600 (9.9)   Urine (mL/kg/hr) 2850 (2) 1200 (1.9)   Total Output 2850 1200   Net +150 -600         Awake, alert, oriented to person, place, reason for admission Does not track to right Good strength LUE/LLE No voluntary movement RUE/RLE Wound c/d/i  LABS: Lab Results  Component Value Date   CREATININE 0.80 10/14/2013   BUN 8 10/14/2013   NA 140 10/15/2013   K 3.0* 10/15/2013   CL 103 10/14/2013   CO2 23 10/14/2013   Lab Results  Component Value Date   WBC 6.6 10/14/2013   HGB 13.3 10/15/2013   HCT 39.0 10/15/2013   MCV 85.8 10/14/2013   PLT 257 10/14/2013    IMPRESSION: - 73 y.o. male POD# 3 s/p left crani for chronic SDH with significant postop right neglect - Neglect has not improved since postop  PLAN: - Will repeat CTH again tomorrow am - Cont Keppra - Cont therapy

## 2013-10-18 NOTE — Progress Notes (Signed)
Inpatient Diabetes Program Recommendations  AACE/ADA: New Consensus Statement on Inpatient Glycemic Control (2013)  Target Ranges:  Prepandial:   less than 140 mg/dL      Peak postprandial:   less than 180 mg/dL (1-2 hours)      Critically ill patients:  140 - 180 mg/dL   Recommend decreasing Lantus to 35 units.  Hypoglycemia this morning with CBG=52. Will follow. Thank you  Piedad ClimesGina Nyesha Cliff BSN, RN,CDE Inpatient Diabetes Coordinator [phone] (team pager)

## 2013-10-18 NOTE — Progress Notes (Signed)
Speech Language Pathology Treatment: Cognitive-Linquistic  Patient Details Name: Arthur Bailey MRN: [id number] DOB: 06-03-1940 Today's Date: 10/18/2013 Time: [id number] SLP Time Calculation (min): 16 min  Assessment / Plan / Recommendation Clinical Impression  Pt requires Max cues for basic problem solving to utilize calendar for orientation clues. [facility], disoriented to time and situation. Pt required Mod-Max cues to scan to the right while reading calendar. SLP provided Max cues for intellectual and emergent awareness of cognitive and physical impairments. Will continue to follow.   HPI HPI: Patient is a 73 y.o. male presenting with weakness and altered mental status. CT Head revealed acute to subacute left frontal subdural hematoma measuring up to 2.5 cm, with mass effect on the left frontal lobe and 7 mm left to right midline shift. Pt underwent craniotomy for evacuation 7/26.   Pertinent Vitals n/a  SLP Plan  Continue with current plan of care    Recommendations                Follow up Recommendations: Inpatient Rehab;24 hour supervision/assistance Plan: Continue with current plan of care    GO      Arthur Bailey, M.A. CCC-SLP [phone]  Arthur Bailey, Arthur Bailey 10/18/2013, 4:05 PM

## 2013-10-18 NOTE — Clinical Social Work Placement (Addendum)
Clinical Social Work Department CLINICAL SOCIAL WORK PLACEMENT NOTE 10/18/2013  Patient:  Arthur Bailey,Arthur Bailey  Account Number:  [id number] Admit date:  10/14/2013  Clinical Social Worker:  Macario GoldsJESSE SCINTO, LCSW  Date/time:  10/18/2013 02:00 PM  Clinical Social Work is seeking post-discharge placement for this patient at the following level of care:   SKILLED NURSING   (*CSW will update this form in Epic as items are completed)   10/18/2013  Patient/family provided with [facility] Department of Clinical Social Work's list of facilities offering this level of care within the geographic area requested by the patient (or if unable, by the patient's family).  10/18/2013  Patient/family informed of their freedom to choose among providers that offer the needed level of care, that participate in Medicare, Medicaid or managed care program needed by the patient, have an available bed and are willing to accept the patient.  10/18/2013  Patient/family informed of [facility]' ownership interest in [facility], as well as of the fact that they are under no obligation to receive care at this facility.  PASARR submitted to EDS on 10/18/2013 PASARR number received on 10/18/2013  FL2 transmitted to all facilities in geographic area requested by pt/family on  10/18/2013 FL2 transmitted to all facilities within larger geographic area on   Patient informed that his/her managed care company has contracts with or will negotiate with  certain facilities, including the following:     Patient/family informed of bed offers received:   10/24/2013 [facility] Physician recommends and patient chooses bed at    [facility]  10/[facility]/2013 Patient to be transferred to facility by [facility] Patient and family notified of transfer on 10/30/2013 Name of family member notified:  Pt's POA Leonette Most(Charles Colmer [phone]) notified via phone.  The following  physician request were entered in Epic:   Additional Comments:  Marcelline DeistEmily Dalesha Stanback, MSW, [facility] Licensed Clinical Social Worker [phone] [phone] [id number] [phone]

## 2013-10-18 NOTE — Progress Notes (Signed)
Physical Therapy Treatment Patient Details Name: Arthur Bailey MRN: [id number] DOB: [birth year] Today's Date: 10/18/2013    History of Present Illness Patient is a 73 y.o. male presenting with weakness and altered mental status. CT Head revealed acute to subacute left frontal subdural hematoma measuring up to 2.5 cm, with mass effect on the left frontal lobe and 7 mm left to right midline shift. Pt underwent craniotomy for evacuation 7/26.      PT Comments    Patient with very modest progress, continues to demonstrate no functional use of ride side, poor attention to the right, and difficulty with balance and mobility. Will continue to see as indicated, may need to consider downgrading goals if no progress next session.   Follow Up Recommendations  CIR;Supervision/Assistance - 24 hour     Equipment Recommendations   (tbd)    Recommendations for Other Services Rehab consult     Precautions / Restrictions Precautions Precautions: Fall    Mobility  Bed Mobility Overal bed mobility: Needs Assistance;+2 for physical assistance Bed Mobility: Supine to Sit     Supine to sit: Max assist;+2 for physical assistance        Transfers Overall transfer level: Needs assistance Equipment used:  (face to face with gait belt and chuck pad)   Sit to Stand: Max assist;+2 physical assistance Stand pivot transfers: Max assist;+2 physical assistance       General transfer comment: Patient required assist to elevate to standing, cues for upright posture, patient unable to initiate strides to chair but could take some weight through BLEs  Ambulation/Gait                 Stairs            Wheelchair Mobility    Modified Rankin (Stroke Patients Only)       Balance Overall balance assessment: Needs assistance Sitting-balance support: Single extremity supported Sitting balance-Leahy Scale: Poor Sitting balance - Comments: moderate assist required Postural control:  Posterior lean;Right lateral lean Standing balance support: Bilateral upper extremity supported;During functional activity Standing balance-Leahy Scale: Zero                      Cognition Arousal/Alertness: Awake/alert Behavior During Therapy: WFL for tasks assessed/performed;Flat affect Overall Cognitive Status: Impaired/Different from baseline Area of Impairment: Orientation;Attention;Following commands;Safety/judgement;Awareness;Problem solving Orientation Level: Disoriented to;Time;Situation Current Attention Level: Focused Memory: Decreased short-term memory Following Commands: Follows one step commands inconsistently;Follows one step commands with increased time Safety/Judgement: Decreased awareness of safety;Decreased awareness of deficits Awareness: Intellectual Problem Solving: Slow processing;Decreased initiation;Difficulty sequencing;Requires verbal cues;Requires tactile cues General Comments: patient continues to demonstrate difficulty attending to right side, patient also confused and perseverating on "getting his car"    Exercises      General Comments General comments (skin integrity, edema, etc.): trunk control and facilitation for posture and balance. Reaching activities. Lateral repositioning on elbow to facilitate midline. Trunk flexion extension seated EOB. Attempted LE ROM, no movement noted on right side.       Pertinent Vitals/Pain No pain reported, VSS     Home Living Family/patient expects to be discharged to:: Private residence Living Arrangements: Alone Available Help at Discharge: Neighbor Type of Home: Apartment Home Access: Stairs to enter Entrance Stairs-Rails: Right;Left Home Layout: One level Home Equipment: None      Prior Function Level of Independence: Independent          PT Goals (current goals can now be found in the care  plan section) Acute Rehab PT Goals Patient Stated Goal: to go home PT Goal Formulation: With  patient Time For Goal Achievement: 10/31/13 Potential to Achieve Goals: Fair Progress towards PT goals: Progressing toward goals (modest, pt with no evidence of recovery R sided movement)    Frequency  Min 3X/week    PT Plan      Co-evaluation             End of Session Equipment Utilized During Treatment: Gait belt Activity Tolerance: Patient tolerated treatment well;Patient limited by fatigue Patient left: in chair;with call bell/phone within reach     Time: [id number] PT Time Calculation (min): 27 min  Charges:  $Therapeutic Activity: 23-37 mins                    G CodesFabio Asa:      Arthur Bailey J 10/18/2013, 1:29 PM Charlotte Crumbevon Manly Nestle, PT DPT  [phone]

## 2013-10-18 NOTE — Evaluation (Signed)
Occupational Therapy Evaluation Patient Details Name: Arthur Bailey MRN: [id number] DOB: 26-Feb-1941 Today's Date: 10/18/2013    History of Present Illness Patient is a 73 y.o. male presenting with weakness and altered mental status. CT Head revealed acute to subacute left frontal subdural hematoma measuring up to 2.5 cm, with mass effect on the left frontal lobe and 7 mm left to right midline shift. Pt underwent craniotomy for evacuation 7/26.     Clinical Impression   Pt presents to OT with decreased I with ADL activity due to problems listed below. Pt will benefit form skilled OT to increase I with ADL activity and return to PLOF    Follow Up Recommendations  CIR    Equipment Recommendations  Other (comment) (TBD)    Recommendations for Other Services Rehab consult     Precautions / Restrictions Precautions Precautions: Fall              ADL Overall ADL's : Needs assistance/impaired     Grooming: Wash/dry face;Bed level;Minimal assistance Grooming Details (indicate cue type and reason): using LUe only                               General ADL Comments: OT eval focused on vision, RUE , as well as A ing pt with cell phone which was very important to pt.  Pt is not able to access or find phone numbers on his cell phone.  OT provided VC- but pt not able to follow the steps to find number that OT added to phone for pt (niece- Andora).  Pt was challenged with following directions, as well  as using LUe to naviagate phone.     Vision Eye Alignment: Within Functional Limits Alignment/Gaze Preference: Other (comment) (gaze midline to left)   Tracking/Visual Pursuits: Impaired - to be further tested in functional context (pt did not track at all.  Pt would move eyes to see objects in the room but would not track upon command)   Convergence: Impaired (comment)                Hand Dominance Right   Extremity/Trunk Assessment Upper Extremity  Assessment Upper Extremity Assessment: RUE deficits/detail;LUE deficits/detail RUE Deficits / Details: RUE with increased tone.  Very difficult to perform PROM.  unable to obtain full PROM RUE Coordination: decreased fine motor;decreased gross motor LUE Deficits / Details: generalized weakness           Communication Communication Communication: No difficulties   Cognition Arousal/Alertness: Awake/alert Behavior During Therapy: WFL for tasks assessed/performed;Flat affect Overall Cognitive Status: Impaired/Different from baseline Area of Impairment: Orientation;Attention;Following commands;Safety/judgement;Awareness;Problem solving Orientation Level: Disoriented to;Time Current Attention Level: Focused Memory: Decreased short-term memory Following Commands: Follows one step commands inconsistently;Follows one step commands with increased time Safety/Judgement: Decreased awareness of safety;Decreased awareness of deficits Awareness: Intellectual Problem Solving: Slow processing;Decreased initiation;Difficulty sequencing;Requires verbal cues;Requires tactile cues General Comments: patient demonstrates some neglect and poor awareness to the right, patient also with inconsitent ability to follow simple commands with L/R discrimination difficulty.   General Comments   decreased awareness of status of RUE or how challenging using his cell phone was.       Shoulder Instructions  educated pt regarding good positioning of RUE supported on pillow    Home Living Family/patient expects to be discharged to:: Private residence Living Arrangements: Alone Available Help at Discharge: Neighbor Type of Home: Apartment Home Access: Stairs to enter  Entrance Stairs-Number of Steps: 5 Entrance Stairs-Rails: Right;Left Home Layout: One level     Bathroom Shower/Tub: Chief Strategy OfficerTub/shower unit   Bathroom Toilet: Standard     Home Equipment: None          Prior Functioning/Environment Level of  Independence: Independent             OT Diagnosis: Generalized weakness;Disturbance of vision;Hemiplegia dominant side   OT Problem List: Decreased strength;Decreased range of motion;Decreased activity tolerance;Impaired vision/perception;Impaired balance (sitting and/or standing);Decreased cognition;Decreased knowledge of precautions;Decreased safety awareness;Impaired UE functional use;Decreased coordination   OT Treatment/Interventions: Self-care/ADL training;Therapeutic exercise;Neuromuscular education;Patient/family education;Visual/perceptual remediation/compensation;DME and/or AE instruction;Therapeutic activities;Cognitive remediation/compensation    OT Goals(Current goals can be found in the care plan section) Acute Rehab OT Goals Patient Stated Goal: to go home- i like to bowl and cook OT Goal Formulation: With patient Time For Goal Achievement: 11/01/13 Potential to Achieve Goals: Good ADL Goals Pt Will Perform Eating: with min guard assist;sitting Pt Will Perform Grooming: with min guard assist;standing;sitting Pt Will Transfer to Toilet: with min assist;bedside commode;stand pivot transfer Pt Will Perform Toileting - Clothing Manipulation and hygiene: with min assist;sit to/from stand Additional ADL Goal #1: Pt will direct caregiver in proper position of RUe on pillow with all joints supported  with min A Additional ADL Goal #2: Pt will interact with his enviroment on the R with ADL activity with min A and VC  OT Frequency: Min 2X/week   Barriers to D/C: Decreased caregiver support             End of Session    Activity Tolerance: Patient tolerated treatment well Patient left: in bed;with call bell/phone within reach   Time: 1025-1048 OT Time Calculation (min): 23 min Charges:  OT General Charges $OT Visit: 1 Procedure OT Evaluation $Initial OT Evaluation Tier I: 1 Procedure OT Treatments $Self Care/Home Management : 8-22 mins G-Codes:    Einar CrowEDDING,  Nyeshia Mysliwiec D 10/18/2013, 12:30 PM

## 2013-10-18 NOTE — Clinical Social Work Note (Signed)
Clinical Social Work Department BRIEF PSYCHOSOCIAL ASSESSMENT 10/18/2013  Patient:  Arthur Bailey, Arthur Bailey     Account Number:  [id number]     [place] date:  10/14/2013  Clinical Social Worker:  Myles Lipps  Date/Time:  10/18/2013 02:00 PM  Referred by:  RN  Date Referred:  10/18/2013 Referred for  SNF Placement   Other Referral:   Interview type:  Patient Other interview type:   No family / friends at bedside    PSYCHOSOCIAL DATA Living Status:  ALONE Admitted from facility:   Level of care:   Primary support name:  Rodolph Bong  [phone] Primary support relationship to patient:  FAMILY Degree of support available:   Adequate    CURRENT CONCERNS Current Concerns  Post-Acute Placement   Other Concerns:    SOCIAL WORK ASSESSMENT / PLAN Clinical Social Worker met with patient at bedside to offer support and discuss patient needs at discharge.  Patient states that he lives at home alone and understands that he cannot safely return home at this time.  Patient is agreeable with [facility] in [facility].  CSW to complete FL2 and initiate search.  CSW to follow up with patient regarding available bed offers.  Patient is hopeful that he will soon be able to return home alone.  CSW remains available for support and to facilitate patient discharge needs once medically stable.   Assessment/plan status:  Psychosocial Support/Ongoing Assessment of Needs Other assessment/ plan:   Information/referral to community resources:   Clinical Social Worker to provide patient and patient family with facility list once bed offers are present. Patient requested charging of his cell phone so he could retrieve family phone numbers - CSW completed.    PATIENT'S/FAMILY'S RESPONSE TO PLAN OF CARE: Patient was alert and oriented x3 sitting in the chair upon CSW arrival.  Patient very engaged in conversation and repeated back plans for SNF placement at discharge. Patient is reluctant  but agreeable for his own safety. Patient states that his primary support are his neighbors but in an emergency his niece is primary contact.  Patient verbalized understanding of CSW role and appreciation for support.
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
## 2013-10-19 ENCOUNTER — Inpatient Hospital Stay (HOSPITAL_COMMUNITY): Payer: Medicare Other

## 2013-10-19 LAB — GLUCOSE, CAPILLARY
GLUCOSE-CAPILLARY: 45 mg/dL — AB (ref 70–99)
GLUCOSE-CAPILLARY: 115 mg/dL — AB (ref 70–99)
GLUCOSE-CAPILLARY: 102 mg/dL — AB (ref 70–99)
Glucose-Capillary: 45 mg/dL — ABNORMAL LOW (ref 70–99)
Glucose-Capillary: 100 mg/dL — ABNORMAL HIGH (ref 70–99)
Glucose-Capillary: 155 mg/dL — ABNORMAL HIGH (ref 70–99)
Glucose-Capillary: 61 mg/dL — ABNORMAL LOW (ref 70–99)
Glucose-Capillary: 144 mg/dL — ABNORMAL HIGH (ref 70–99)

## 2013-10-19 NOTE — Progress Notes (Signed)
Pt seen and examined. No issues overnight. Continues to deny any complaints  EXAM: Temp:  [97.9 F (36.6 C)-100.7 F (38.2 C)] 98.1 F (36.7 C) (07/30 0800) Pulse Rate:  [55-95] 74 (07/30 0800) Resp:  [13-33] 21 (07/30 0800) BP: (93-181)/(55-83) 148/66 mmHg (07/30 0800) SpO2:  [93 %-99 %] 96 % (07/30 0800) Intake/Output     07/29 0701 - 07/30 0700 07/30 0701 - 07/31 0700   P.O.     I.V. (mL/kg) 2200 (36.2) 100 (1.6)   Total Intake(mL/kg) 2200 (36.2) 100 (1.6)   Urine (mL/kg/hr) 3100 (2.1) 325 (3.3)   Total Output 3100 325   Net -900 -225         Awake, alert, oriented to person, place Follows commands briskly on L No spontaneous movement on R Increased tone RUE/RLE Wound c/d/i  LABS: Lab Results  Component Value Date   CREATININE 0.80 10/14/2013   BUN 8 10/14/2013   NA 140 10/15/2013   K 3.0* 10/15/2013   CL 103 10/14/2013   CO2 23 10/14/2013   Lab Results  Component Value Date   WBC 6.6 10/14/2013   HGB 13.3 10/15/2013   HCT 39.0 10/15/2013   MCV 85.8 10/14/2013   PLT 257 10/14/2013    IMPRESSION: - 73 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

## 2013-10-19 NOTE — Progress Notes (Signed)
Physical Therapy Treatment Patient Details Name: Arthur Bailey MRN: [id number] DOB: 1940/05/24 Today's Date: 10/19/2013    History of Present Illness Patient is a 73 y.o. male presenting with weakness and altered mental status. CT Head revealed acute to subacute left frontal subdural hematoma measuring up to 2.5 cm, with mass effect on the left frontal lobe and 7 mm left to right midline shift. Pt underwent craniotomy for evacuation 7/26.      PT Comments    Patient with some modest improvements in balance at EOB today. Continued efforts to address trunk control throughout session. Patient still unable to stand or transfer without +2 max assist.  No noted movement on right side during session.  Patient OOB to chair. Will continue to see and progress as tolerated.    Follow Up Recommendations  CIR;Supervision/Assistance - 24 hour     Equipment Recommendations   (tbd)    Recommendations for Other Services Rehab consult     Precautions / Restrictions Precautions Precautions: Fall    Mobility  Bed Mobility Overal bed mobility: Needs Assistance;+2 for physical assistance Bed Mobility: Supine to Sit     Supine to sit: Max assist;+2 for physical assistance        Transfers Overall transfer level: Needs assistance Equipment used:  (face to face with gait belt and chuck pad)   Sit to Stand: Max assist;+2 physical assistance Stand pivot transfers: Max assist;+2 physical assistance       General transfer comment: Bilateral assist with gait belt and chuck pad, face to face transfer.  Ambulation/Gait                 Stairs            Wheelchair Mobility    Modified Rankin (Stroke Patients Only)       Balance     Sitting balance-Leahy Scale: Poor Sitting balance - Comments: moderate assist required Postural control: Posterior lean;Right lateral lean Standing balance support: Bilateral upper extremity supported Standing balance-Leahy Scale:  Zero                      Cognition Arousal/Alertness: Awake/alert Behavior During Therapy: WFL for tasks assessed/performed;Flat affect Overall Cognitive Status: Impaired/Different from baseline Area of Impairment: Orientation;Attention;Following commands;Safety/judgement;Awareness;Problem solving Orientation Level: Disoriented to;Time;Situation Current Attention Level: Focused Memory: Decreased short-term memory Following Commands: Follows one step commands inconsistently;Follows one step commands with increased time Safety/Judgement: Decreased awareness of safety;Decreased awareness of deficits Awareness: Intellectual Problem Solving: Slow processing;Decreased initiation;Difficulty sequencing;Requires verbal cues;Requires tactile cues General Comments: Patient with continued confusion today, states that he doesnt know where he is, also reaching out for phone saying "these are my keys i need to go"    Exercises      General Comments General comments (skin integrity, edema, etc.): continued to work on trunk control with some improvements noted today, patient with less posterior lean, able to hold himself EOB with single extremity supported and no assist for >20 seconds today      Pertinent Vitals/Pain No pain, VSS    Home Living                      Prior Function            PT Goals (current goals can now be found in the care plan section) Acute Rehab PT Goals Patient Stated Goal: to go home PT Goal Formulation: With patient Time For Goal Achievement: 10/31/13 Potential to Achieve  Goals: Fair Progress towards PT goals: Progressing toward goals    Frequency  Min 2X/week    PT Plan Current plan remains appropriate;Frequency needs to be updated    Co-evaluation             End of Session Equipment Utilized During Treatment: Gait belt Activity Tolerance: Patient tolerated treatment well;Patient limited by fatigue Patient left: in chair;with call  bell/phone within reach     Time: 1610-9604 PT Time Calculation (min): 26 min  Charges:  $Therapeutic Activity: 23-37 mins                    G CodesFabio Asa 10/29/13, 12:10 PM Charlotte Crumb, PT DPT  [phone]

## 2013-10-19 NOTE — Progress Notes (Addendum)
Inpatient Diabetes Program Recommendations  AACE/ADA: New Consensus Statement on Inpatient Glycemic Control (2013)  Target Ranges:  Prepandial:   less than 140 mg/dL      Peak postprandial:   less than 180 mg/dL (1-2 hours)      Critically ill patients:  140 - 180 mg/dL  Results for Arthur Bailey, [name] (MRN [id number]) as of 10/19/2013 09:17  Ref. Range 10/18/2013 23:47 10/19/2013 03:22 10/19/2013 03:23 10/19/2013 03:57 10/19/2013 08:05  Glucose-Capillary Latest Range: 70-99 mg/dL 92 45 (L) 45 (L) 213100 (H) 115 (H)   Decrease Lantus to 10 units. ADDENDUM: after speaking with Ollen GrossPaul S. RN, it appears patient has T-2 DM and does not require basal at this time.  Recommend holding morning dose and monitoring CBGs today and re-evaluate need for basal at this time.   Thank you  Piedad ClimesGina Hilberto Burzynski BSN, RN,CDE Inpatient Diabetes Coordinator [phone] (team pager)

## 2013-10-20 ENCOUNTER — Inpatient Hospital Stay (HOSPITAL_COMMUNITY): Payer: Medicare Other

## 2013-10-20 LAB — GLUCOSE, CAPILLARY
GLUCOSE-CAPILLARY: 163 mg/dL — AB (ref 70–99)
GLUCOSE-CAPILLARY: 269 mg/dL — AB (ref 70–99)
Glucose-Capillary: 137 mg/dL — ABNORMAL HIGH (ref 70–99)
Glucose-Capillary: 199 mg/dL — ABNORMAL HIGH (ref 70–99)
Glucose-Capillary: 102 mg/dL — ABNORMAL HIGH (ref 70–99)
Glucose-Capillary: 182 mg/dL — ABNORMAL HIGH (ref 70–99)
Glucose-Capillary: 173 mg/dL — ABNORMAL HIGH (ref 70–99)

## 2013-10-20 NOTE — Progress Notes (Signed)
Occupational Therapy Treatment Patient Details Name: Arthur Bailey E XXX Brannock MRN: [id number] DOB: 1941-02-03 Today's Date: 10/20/2013    History of present illness Patient is a 73 y.o. male presenting with weakness and altered mental status. CT Head revealed acute to subacute left frontal subdural hematoma measuring up to 2.5 cm, with mass effect on the left frontal lobe and 7 mm left to right midline shift. Pt underwent craniotomy for evacuation 7/26.     OT comments  Pt demonstrating progress. Able to feed self with proper set up and verbal cuing for initiation. Increased attention to R visual field during functional tasks, although requires mod tactile and vc to locate his RUE. Increased flexor tone RUE. Apparently having visual hallucinations during session; tangential conversation. Pt will need rehab at SNF. Will continue to follow to maximize functional level of independence with ADL and mobility.   Follow Up Recommendations  SNF;Supervision/Assistance - 24 hour    Equipment Recommendations  Other (comment)    Recommendations for Other Services      Precautions / Restrictions Precautions Precautions: Fall       Mobility Bed Mobility Overal bed mobility: Needs Assistance;+2 for physical assistance Bed Mobility: Supine to Sit;Sit to Supine     Supine to sit: Max assist Sit to supine: +2 for physical assistance;Max assist      Transfers                      Balance Overall balance assessment: Needs assistance Sitting-balance support: Feet supported;Single extremity supported Sitting balance-Leahy Scale: Zero   Postural control: Right lateral lean;Posterior lean                         ADL Overall ADL's : Needs assistance/impaired Eating/Feeding: Moderate assistance;Sitting Eating/Feeding Details (indicate cue type and reason): Sat EOB with max A for postural control. Required utensil to be loaded, then pt would initate hand to mouth pattern. Externally  distracted. Mod cues to locate items in R field                                          Vision                 Additional Comments: L gaze preference   Perception     Praxis      Cognition   Behavior During Therapy: Flat affect Overall Cognitive Status: Impaired/Different from baseline Area of Impairment: Orientation;Attention;Memory;Following commands;Safety/judgement;Awareness;Problem solving Orientation Level: Disoriented to;Time;Situation Current Attention Level: Sustained Memory: Decreased recall of precautions;Decreased short-term memory  Following Commands: Follows one step commands with increased time Safety/Judgement: Decreased awareness of safety;Decreased awareness of deficits Awareness: Emergent Problem Solving: Slow processing;Decreased initiation;Difficulty sequencing;Requires verbal cues;Requires tactile cues General Comments: More goal oriented behavior today. Able to self feed with tactile prompts    Extremity/Trunk Assessment               Exercises     Shoulder Instructions       General Comments      Pertinent Vitals/ Pain       VSS. No c/o pain  Home Living                                          Prior  Functioning/Environment              Frequency Min 2X/week     Progress Toward Goals  OT Goals(current goals can now be found in the care plan section)  Progress towards OT goals: Progressing toward goals  Acute Rehab OT Goals Patient Stated Goal: to go home OT Goal Formulation: With patient Time For Goal Achievement: 11/01/13 Potential to Achieve Goals: Good ADL Goals Pt Will Perform Eating: with min guard assist;sitting Pt Will Perform Grooming: with min guard assist;standing;sitting Pt Will Transfer to Toilet: with min assist;bedside commode;stand pivot transfer Pt Will Perform Toileting - Clothing Manipulation and hygiene: with min assist;sit to/from stand Additional ADL Goal  #1: Pt will direct caregiver in proper position of RUe on pillow with all joints supported  with min A Additional ADL Goal #2: Pt will interact with his enviroment on the R with ADL activity with min A and VC  Plan Discharge plan needs to be updated    Co-evaluation                 End of Session     Activity Tolerance Patient tolerated treatment well   Patient Left in bed;with call bell/phone within reach   Nurse Communication Mobility status;Other (comment) (approach to increasing pt independence with self feeding)        Time: 6962-9528 OT Time Calculation (min): 29 min  Charges: OT General Charges $OT Visit: 1 Procedure OT Treatments $Self Care/Home Management : 23-37 mins  Wyolene Weimann,HILLARY 10/20/2013, 2:21 PM   [facility], OTR/L  [phone] 10/20/2013

## 2013-10-20 NOTE — Progress Notes (Signed)
Late entry from 10/19/13:  Pt provided with NH bed offers for his/his family to review.  RN present during CSW conversation and will pass along info to oncoming RN.

## 2013-10-20 NOTE — Progress Notes (Signed)
Pt seen and examined. No issues overnight. Continues to report no complaints. Did participate in therapy yesterday.  EXAM: Temp:  [97.5 F (36.4 C)-98.8 F (37.1 C)] 98.2 F (36.8 C) (07/31 1600) Pulse Rate:  [52-79] 58 (07/31 1900) Resp:  [16-29] 16 (07/31 1900) BP: (129-164)/(46-88) 154/62 mmHg (07/31 1900) SpO2:  [95 %-100 %] 98 % (07/31 1900) Intake/Output     07/31 0701 - 08/01 0700   P.O.    I.V. (mL/kg) 1200 (19.8)   Total Intake(mL/kg) 1200 (19.8)   Urine (mL/kg/hr) 1475 (1.9)   Total Output 1475   Net -275        Awake, alert, oriented to person, place Tracks to the right Moves LUE/LLE to command, good strength No voluntary movement of RUE/RLE Wound c/d/i  LABS: Lab Results  Component Value Date   CREATININE 0.80 10/14/2013   BUN 8 10/14/2013   NA 140 10/15/2013   K 3.0* 10/15/2013   CL 103 10/14/2013   CO2 23 10/14/2013   Lab Results  Component Value Date   WBC 6.6 10/14/2013   HGB 13.3 10/15/2013   HCT 39.0 10/15/2013   MCV 85.8 10/14/2013   PLT 257 10/14/2013    IMAGING: MRI brain reviewed, demonstrates septated chronic SDH posterior to the prior craniotomy with continued mass effect on the left hemisphere. No large vessel stroke is seen.  IMPRESSION: - 73 y.o. male s/p crani for SDH with persistent septated SDH and mass effect, likely responsible for worsening sx.  PLAN: - Will plan on repeat craniotomy for evacuation of residual SDH on Mon am. - cont Keppra

## 2013-10-20 NOTE — Progress Notes (Signed)
Speech Language Pathology Treatment: Cognitive-Linquistic  Patient Details Name: Arthur Bailey MRN: [id number] DOB: 1940-10-20 Today's Date: 10/20/2013 Time: [id number] SLP Time Calculation (min): 17 min  Assessment / Plan / Recommendation Clinical Impression  Pt is oriented to person and knows he is in the hospital because something happened to his brain. Pt required Max cues to utilize schedule for temporal orientation. With Max multimodal cueing, patient was able to demonstrate intellectual and emergent awareness of right-sided weakness. Pt participated in divergent naming task with Mod-Max cues for word generation. Will continue to follow.   HPI HPI: Patient is a 73 y.o. male presenting with weakness and altered mental status. CT Head revealed acute to subacute left frontal subdural hematoma measuring up to 2.5 cm, with mass effect on the left frontal lobe and 7 mm left to right midline shift. Pt underwent craniotomy for evacuation 7/26.   Pertinent Vitals n/a  SLP Plan  Continue with current plan of care    Recommendations                Follow up Recommendations: Skilled Nursing facility;24 hour supervision/assistance Plan: Continue with current plan of care    GO      Arthur Bailey, Arthur Bailey [phone]  Arthur Bailey, Arthur Bailey 10/20/2013, 4:54 PM

## 2013-10-21 LAB — GLUCOSE, CAPILLARY
GLUCOSE-CAPILLARY: 265 mg/dL — AB (ref 70–99)
GLUCOSE-CAPILLARY: 132 mg/dL — AB (ref 70–99)
GLUCOSE-CAPILLARY: 151 mg/dL — AB (ref 70–99)
Glucose-Capillary: 152 mg/dL — ABNORMAL HIGH (ref 70–99)
Glucose-Capillary: 168 mg/dL — ABNORMAL HIGH (ref 70–99)

## 2013-10-21 NOTE — Progress Notes (Signed)
Patient ID: Arthur Bailey, male   DOB: Apr 21, 1940, 73 y.o.   MRN: [id number] BP 130/56  Pulse 60  Temp(Src) 98 F (36.7 C) (Oral)  Resp 18  Ht 5\' 8"  (1.727 m)  Wt 60.7 kg (133 lb 13.1 oz)  BMI 20.35 kg/m2  SpO2 95% Alert, following commands perrl Wound is clean, dry, no signs of infection Complete right neglect.  Or on Monday

## 2013-10-22 LAB — CBC WITH DIFFERENTIAL/PLATELET
BASOS PCT: 0 % (ref 0–1)
Basophils Absolute: 0 10*3/uL (ref 0.0–0.1)
Eosinophils Absolute: 0.5 10*3/uL (ref 0.0–0.7)
Eosinophils Relative: 8 % — ABNORMAL HIGH (ref 0–5)
HEMATOCRIT: 38.2 % — AB (ref 39.0–52.0)
HEMOGLOBIN: 12.6 g/dL — AB (ref 13.0–17.0)
LYMPHS PCT: 28 % (ref 12–46)
Lymphs Abs: 1.7 10*3/uL (ref 0.7–4.0)
MCH: 27.2 pg (ref 26.0–34.0)
MCHC: 33 g/dL (ref 30.0–36.0)
MCV: 82.5 fL (ref 78.0–100.0)
MONO ABS: 0.5 10*3/uL (ref 0.1–1.0)
MONOS PCT: 8 % (ref 3–12)
Neutro Abs: 3.4 10*3/uL (ref 1.7–7.7)
Neutrophils Relative %: 56 % (ref 43–77)
Platelets: 279 10*3/uL (ref 150–400)
RBC: 4.63 MIL/uL (ref 4.22–5.81)
RDW: 13.5 % (ref 11.5–15.5)
WBC: 6 10*3/uL (ref 4.0–10.5)

## 2013-10-22 LAB — GLUCOSE, CAPILLARY
GLUCOSE-CAPILLARY: 145 mg/dL — AB (ref 70–99)
Glucose-Capillary: 164 mg/dL — ABNORMAL HIGH (ref 70–99)
Glucose-Capillary: 170 mg/dL — ABNORMAL HIGH (ref 70–99)
Glucose-Capillary: 198 mg/dL — ABNORMAL HIGH (ref 70–99)

## 2013-10-22 LAB — BASIC METABOLIC PANEL
Anion gap: 12 (ref 5–15)
BUN: 10 mg/dL (ref 6–23)
CHLORIDE: 103 meq/L (ref 96–112)
CO2: 23 meq/L (ref 19–32)
CREATININE: 0.6 mg/dL (ref 0.50–1.35)
Calcium: 8.8 mg/dL (ref 8.4–10.5)
GFR calc Af Amer: >90 mL/min (ref >90)
GFR calc non Af Amer: >90 mL/min (ref >90)
Glucose, Bld: 173 mg/dL — ABNORMAL HIGH (ref 70–99)
Potassium: 4 mEq/L (ref 3.7–5.3)
Sodium: 138 mEq/L (ref 137–147)

## 2013-10-22 LAB — PROTIME-INR
INR: 1.03 (ref 0.00–1.49)
Prothrombin Time: 13.5 seconds (ref 11.6–15.2)

## 2013-10-22 LAB — TYPE AND SCREEN
ABO/RH(D): O POS
Antibody Screen: NEGATIVE

## 2013-10-22 LAB — APTT: aPTT: 31 seconds (ref 24–37)

## 2013-10-22 MED ORDER — LIDOCAINE-PRILOCAINE 2.5-2.5 % EX CREA
1.0000 "application " | TOPICAL_CREAM | Freq: Once | CUTANEOUS | Status: DC
  Filled 2013-10-22: qty 5

## 2013-10-22 MED ORDER — OXYMETAZOLINE HCL 0.05 % NA SOLN
2.0000 | Freq: Once | NASAL | Status: DC
  Filled 2013-10-22: qty 15

## 2013-10-22 NOTE — Progress Notes (Signed)
I spoke with the patient today and reviewed MRI findings as well as the need for repeat surgery for SDH evacuation. I also spoke with the patient's niece, Mrs. Elijah BirkCaldwell over the phone and reviewed the hospital course thus far. I also reviewed the MRI findings from Friday. The rationale for repeat surgery was explained as were the risks and benefits. Risks include need for further surgery for re-accumulation of SDH, SZ, infection. I also discussed the possibility of successful surgery but no significant improvement in clinical condition. I have answered all her questions and she provided consent for the surgery.

## 2013-10-22 NOTE — Progress Notes (Signed)
Patient ID: Arthur Bailey, male   DOB: 28-Dec-1940, 73 y.o.   MRN: [id number] BP 153/66  Pulse 38  Temp(Src) 98.3 F (36.8 C) (Axillary)  Resp 20  Ht 5\' 8"  (1.727 m)  Wt 60.7 kg (133 lb 13.1 oz)  BMI 20.35 kg/m2  SpO2 99% Alert, right side neglect Wound is clean, dry On schedule for OR tomorrow

## 2013-10-23 ENCOUNTER — Encounter (HOSPITAL_COMMUNITY): Payer: Medicare Other | Admitting: Anesthesiology

## 2013-10-23 ENCOUNTER — Encounter (HOSPITAL_COMMUNITY)
Admission: EM | Disposition: A | Discharge status: Skilled Nursing Facility | Payer: Self-pay | Source: Home / Self Care | Attending: Neurosurgery

## 2013-10-23 ENCOUNTER — Inpatient Hospital Stay (HOSPITAL_COMMUNITY): Payer: Medicare Other | Admitting: Anesthesiology

## 2013-10-23 ENCOUNTER — Encounter (HOSPITAL_COMMUNITY): Payer: Self-pay | Admitting: Critical Care Medicine

## 2013-10-23 HISTORY — PX: CRANIOTOMY: SHX93

## 2013-10-23 LAB — GLUCOSE, CAPILLARY
GLUCOSE-CAPILLARY: 164 mg/dL — AB (ref 70–99)
GLUCOSE-CAPILLARY: 183 mg/dL — AB (ref 70–99)
Glucose-Capillary: 173 mg/dL — ABNORMAL HIGH (ref 70–99)
Glucose-Capillary: 195 mg/dL — ABNORMAL HIGH (ref 70–99)
Glucose-Capillary: 161 mg/dL — ABNORMAL HIGH (ref 70–99)

## 2013-10-23 LAB — ABO/RH: ABO/RH(D): O POS

## 2013-10-23 SURGERY — CRANIOTOMY HEMATOMA EVACUATION SUBDURAL
Anesthesia: General | Site: Head | Laterality: Left

## 2013-10-23 MED ORDER — LIDOCAINE HCL (CARDIAC) 20 MG/ML IV SOLN
INTRAVENOUS | Status: DC | PRN
  Administered 2013-10-23: 80 mg via INTRAVENOUS

## 2013-10-23 MED ORDER — 0.9 % SODIUM CHLORIDE (POUR BTL) OPTIME
TOPICAL | Status: DC | PRN
  Administered 2013-10-23 (×4): 1000 mL

## 2013-10-23 MED ORDER — HYDROMORPHONE HCL PF 1 MG/ML IJ SOLN: 0.2500 mg | INTRAMUSCULAR | Status: DC | PRN

## 2013-10-23 MED ORDER — ONDANSETRON HCL 4 MG/2ML IJ SOLN
INTRAMUSCULAR | Status: DC | PRN
  Administered 2013-10-23: 4 mg via INTRAVENOUS

## 2013-10-23 MED ORDER — CEFAZOLIN SODIUM-DEXTROSE 2-3 GM-% IV SOLR
2.0000 g | Freq: Once | INTRAVENOUS | Status: AC
  Administered 2013-10-23: 2 g via INTRAVENOUS
  Filled 2013-10-23: qty 50

## 2013-10-23 MED ORDER — SODIUM CHLORIDE 0.9 % IV SOLN
INTRAVENOUS | Status: DC | PRN
  Administered 2013-10-23: 07:00:00 via INTRAVENOUS

## 2013-10-23 MED ORDER — ROCURONIUM BROMIDE 100 MG/10ML IV SOLN
INTRAVENOUS | Status: DC | PRN
  Administered 2013-10-23: 50 mg via INTRAVENOUS

## 2013-10-23 MED ORDER — BACITRACIN ZINC 500 UNIT/GM EX OINT
TOPICAL_OINTMENT | CUTANEOUS | Status: DC | PRN
  Administered 2013-10-23: 1 via TOPICAL

## 2013-10-23 MED ORDER — OXYCODONE HCL 5 MG/5ML PO SOLN: 5.0000 mg | Freq: Once | ORAL | Status: DC | PRN

## 2013-10-23 MED ORDER — GLYCOPYRROLATE 0.2 MG/ML IJ SOLN
INTRAMUSCULAR | Status: DC | PRN
  Administered 2013-10-23: 0.2 mg via INTRAVENOUS
  Administered 2013-10-23: 0.4 mg via INTRAVENOUS

## 2013-10-23 MED ORDER — EPHEDRINE SULFATE 50 MG/ML IJ SOLN
INTRAMUSCULAR | Status: DC | PRN
  Administered 2013-10-23: 10 mg via INTRAVENOUS

## 2013-10-23 MED ORDER — BACITRACIN 50000 UNITS IM SOLR
INTRAMUSCULAR | Status: DC | PRN
  Administered 2013-10-23: 09:00:00

## 2013-10-23 MED ORDER — THROMBIN 20000 UNITS EX SOLR
CUTANEOUS | Status: DC | PRN
  Administered 2013-10-23: 09:00:00 via TOPICAL

## 2013-10-23 MED ORDER — OXYCODONE HCL 5 MG PO TABS: 5.0000 mg | ORAL_TABLET | Freq: Once | ORAL | Status: DC | PRN

## 2013-10-23 MED ORDER — ESMOLOL HCL 10 MG/ML IV SOLN
INTRAVENOUS | Status: DC | PRN
  Administered 2013-10-23 (×4): 20 mg via INTRAVENOUS

## 2013-10-23 MED ORDER — PHENYLEPHRINE HCL 10 MG/ML IJ SOLN
10.0000 mg | INTRAVENOUS | Status: DC | PRN
  Administered 2013-10-23: 20 ug/min via INTRAVENOUS

## 2013-10-23 MED ORDER — ARTIFICIAL TEARS OP OINT
TOPICAL_OINTMENT | OPHTHALMIC | Status: DC | PRN
  Administered 2013-10-23: 1 via OPHTHALMIC

## 2013-10-23 MED ORDER — VECURONIUM BROMIDE 10 MG IV SOLR
INTRAVENOUS | Status: DC | PRN
  Administered 2013-10-23: 1 mg via INTRAVENOUS

## 2013-10-23 MED ORDER — LABETALOL HCL 5 MG/ML IV SOLN
INTRAVENOUS | Status: DC | PRN
  Administered 2013-10-23: 5 mg via INTRAVENOUS

## 2013-10-23 MED ORDER — THROMBIN 5000 UNITS EX SOLR
OROMUCOSAL | Status: DC | PRN
  Administered 2013-10-23: 09:00:00 via TOPICAL

## 2013-10-23 MED ORDER — PROPOFOL 10 MG/ML IV BOLUS
INTRAVENOUS | Status: DC | PRN
  Administered 2013-10-23: 140 mg via INTRAVENOUS
  Administered 2013-10-23 (×2): 10 mg via INTRAVENOUS

## 2013-10-23 MED ORDER — NEOSTIGMINE METHYLSULFATE 10 MG/10ML IV SOLN
INTRAVENOUS | Status: DC | PRN
  Administered 2013-10-23: 3 mg via INTRAVENOUS

## 2013-10-23 MED ORDER — ONDANSETRON HCL 4 MG/2ML IJ SOLN: 4.0000 mg | Freq: Four times a day (QID) | INTRAMUSCULAR | Status: DC | PRN

## 2013-10-23 MED ORDER — FENTANYL CITRATE 0.05 MG/ML IJ SOLN
INTRAMUSCULAR | Status: DC | PRN
  Administered 2013-10-23 (×3): 50 ug via INTRAVENOUS
  Administered 2013-10-23: 150 ug via INTRAVENOUS

## 2013-10-23 SURGICAL SUPPLY — 87 items
APL SKNCLS STERI-STRIP NONHPOA (GAUZE/BANDAGES/DRESSINGS)
BANDAGE GAUZE 4  KLING STR (GAUZE/BANDAGES/DRESSINGS) ×4 IMPLANT
BENZOIN TINCTURE PRP APPL 2/3 (GAUZE/BANDAGES/DRESSINGS) IMPLANT
BLADE 10 SAFETY STRL DISP (BLADE) ×1 IMPLANT
BLADE SURG ROTATE 9660 (MISCELLANEOUS) ×3 IMPLANT
BLADE ULTRA TIP 2M (BLADE) ×1 IMPLANT
BNDG GAUZE ELAST 4 BULKY (GAUZE/BANDAGES/DRESSINGS) ×4 IMPLANT
BRUSH SCRUB EZ 1% IODOPHOR (MISCELLANEOUS) ×3 IMPLANT
BUR ACORN 6.0 PRECISION (BURR) ×2 IMPLANT
BUR ACORN 6.0MM PRECISION (BURR) ×1
BUR ADDG 1.1 (BURR) IMPLANT
BUR ADDG 1.1MM (BURR)
BUR MATCHSTICK NEURO 3.0 LAGG (BURR) IMPLANT
BUR ROUTER D-58 CRANI (BURR) ×2 IMPLANT
CANISTER SUCT 3000ML (MISCELLANEOUS) ×5 IMPLANT
CLIP TI MEDIUM 6 (CLIP) IMPLANT
CONT SPEC 4OZ CLIKSEAL STRL BL (MISCELLANEOUS) ×4 IMPLANT
CORDS BIPOLAR (ELECTRODE) ×3 IMPLANT
DRAIN SNY WOU 7FLT (WOUND CARE) IMPLANT
DRAPE NEUROLOGICAL W/INCISE (DRAPES) ×3 IMPLANT
DRAPE SURG 17X23 STRL (DRAPES) IMPLANT
DRAPE WARM FLUID 44X44 (DRAPE) ×3 IMPLANT
DRSG TELFA 3X8 NADH (GAUZE/BANDAGES/DRESSINGS) IMPLANT
DURAMATRIX ONLAY 3X3 (Plate) ×2 IMPLANT
DURAPREP 6ML APPLICATOR 50/CS (WOUND CARE) ×3 IMPLANT
ELECT CAUTERY BLADE 6.4 (BLADE) ×1 IMPLANT
ELECT REM PT RETURN 9FT ADLT (ELECTROSURGICAL) ×3
ELECTRODE REM PT RTRN 9FT ADLT (ELECTROSURGICAL) ×1 IMPLANT
EVACUATOR 1/8 PVC DRAIN (DRAIN) IMPLANT
EVACUATOR SILICONE 100CC (DRAIN) IMPLANT
GAUZE SPONGE 4X4 16PLY XRAY LF (GAUZE/BANDAGES/DRESSINGS) IMPLANT
GLOVE BIOGEL PI IND STRL 7.0 (GLOVE) IMPLANT
GLOVE BIOGEL PI IND STRL 7.5 (GLOVE) ×1 IMPLANT
GLOVE BIOGEL PI INDICATOR 7.0 (GLOVE) ×4
GLOVE BIOGEL PI INDICATOR 7.5 (GLOVE) ×2
GLOVE ECLIPSE 7.0 STRL STRAW (GLOVE) ×4 IMPLANT
GLOVE EXAM NITRILE LRG STRL (GLOVE) IMPLANT
GLOVE EXAM NITRILE MD LF STRL (GLOVE) IMPLANT
GLOVE EXAM NITRILE XL STR (GLOVE) IMPLANT
GLOVE EXAM NITRILE XS STR PU (GLOVE) IMPLANT
GLOVE SURG SS PI 7.0 STRL IVOR (GLOVE) ×4 IMPLANT
GOWN STRL REUS W/ TWL LRG LVL3 (GOWN DISPOSABLE) ×2 IMPLANT
GOWN STRL REUS W/ TWL XL LVL3 (GOWN DISPOSABLE) IMPLANT
GOWN STRL REUS W/TWL 2XL LVL3 (GOWN DISPOSABLE) IMPLANT
GOWN STRL REUS W/TWL LRG LVL3 (GOWN DISPOSABLE) ×3
GOWN STRL REUS W/TWL XL LVL3 (GOWN DISPOSABLE) ×3
HEMOSTAT POWDER KIT SURGIFOAM (HEMOSTASIS) ×3 IMPLANT
HEMOSTAT SURGICEL 2X14 (HEMOSTASIS) IMPLANT
KIT BASIN OR (CUSTOM PROCEDURE TRAY) ×3 IMPLANT
KIT ROOM TURNOVER OR (KITS) ×3 IMPLANT
LIDOCAINE 1%W/EPI 1:100,000 IMPLANT
NDL HYPO 25X1 1.5 SAFETY (NEEDLE) ×1 IMPLANT
NEEDLE HYPO 25X1 1.5 SAFETY (NEEDLE) ×3 IMPLANT
NS IRRIG 1000ML POUR BTL (IV SOLUTION) ×7 IMPLANT
NUROLON 4-0 ×4 IMPLANT
PACK CRANIOTOMY (CUSTOM PROCEDURE TRAY) ×3 IMPLANT
PAD DRESSING TELFA 3X8 NADH (GAUZE/BANDAGES/DRESSINGS) ×1 IMPLANT
PATTIES SURGICAL .5 X.5 (GAUZE/BANDAGES/DRESSINGS) IMPLANT
PATTIES SURGICAL .5 X3 (DISPOSABLE) IMPLANT
PATTIES SURGICAL 1X1 (DISPOSABLE) IMPLANT
SCREW SELF DRILL HT 1.5/4MM (Screw) ×8 IMPLANT
SENSORCAINE 0.5% IMPLANT
SPONGE GAUZE 4X4 12PLY (GAUZE/BANDAGES/DRESSINGS) ×3 IMPLANT
SPONGE NEURO XRAY DETECT 1X3 (DISPOSABLE) IMPLANT
SPONGE SURGIFOAM ABS GEL 100 (HEMOSTASIS) ×3 IMPLANT
STAPLER VISISTAT 35W (STAPLE) ×5 IMPLANT
STOCKINETTE 6  STRL (DRAPES)
STOCKINETTE 6 STRL (DRAPES) ×1 IMPLANT
SUT ETHILON 3 0 FSL (SUTURE) IMPLANT
SUT ETHILON 3 0 PS 1 (SUTURE) IMPLANT
SUT NURALON 4 0 TR CR/8 (SUTURE) ×5 IMPLANT
SUT PL GUT 3 0 FS 1 (SUTURE) IMPLANT
SUT STEEL 0 (SUTURE)
SUT STEEL 0 18XMFL TIE 17 (SUTURE) IMPLANT
SUT VIC AB 0 CT1 18XCR BRD8 (SUTURE) ×2 IMPLANT
SUT VIC AB 0 CT1 8-18 (SUTURE) ×9
SUT VIC AB 3-0 SH 8-18 (SUTURE) ×4 IMPLANT
SUT VICRYL 3-0 RB1 18 ABS (SUTURE) ×2 IMPLANT
SYR 20ML ECCENTRIC (SYRINGE) ×3 IMPLANT
SYR CONTROL 10ML LL (SYRINGE) ×4 IMPLANT
TOWEL OR 17X24 6PK STRL BLUE (TOWEL DISPOSABLE) ×3 IMPLANT
TOWEL OR 17X26 10 PK STRL BLUE (TOWEL DISPOSABLE) ×3 IMPLANT
TRAY FOLEY CATH 14FRSI W/METER (CATHETERS) ×1 IMPLANT
TUBE CONNECTING 12'X1/4 (SUCTIONS)
TUBE CONNECTING 12X1/4 (SUCTIONS) ×1 IMPLANT
UNDERPAD 30X30 INCONTINENT (UNDERPADS AND DIAPERS) ×1 IMPLANT
WATER STERILE IRR 1000ML POUR (IV SOLUTION) ×3 IMPLANT

## 2013-10-23 NOTE — Progress Notes (Signed)
PT Cancellation Note  Patient Details Name: Arthur Bailey MRN: [id number] DOB: 1940-11-02   Cancelled Treatment:    Reason Eval/Treat Not Completed: Patient at procedure or test/unavailable   Fabio AsaWerner, Izella Ybanez J 10/23/2013, 8:15 AM Charlotte Crumbevon Keairra Bardon, PT DPT  [phone]

## 2013-10-23 NOTE — Op Note (Signed)
PREOP DIAGNOSIS: Left chronic subdural hematoma  POSTOP DIAGNOSIS: Same  PROCEDURE: 1. Left craniotomy for evacuation of subdural hematoma  SURGEON: Dr. Lisbeth RenshawNeelesh Analaura Messler, MD  ASSISTANT: Dr. Barnett AbuHenry Elsner, MD  ANESTHESIA: General Endotracheal  EBL: 100cc  SPECIMENS: None  DRAINS: None  COMPLICATIONS: None immediate  CONDITION: Hemodynamically stable to ICU  HISTORY: Arthur Bailey is a 73 y.o. male who initially underwent craniotomy for SDH 1 week ago. Postoperatively he demonstrated worsening clinical exam with sever right-sided neglect. MRI showed residual SDH posterior to the initial craniotomy with continued apparent mass effect and no significant stroke to explain the clinical findings. I therefore recommended repeat craniotomy. The risks and benefits of the surgery were explained in detail to the patient's niece. After all her questions were answered, consent was obtained.  PROCEDURE IN DETAIL: After informed consent was obtained and witnessed, the patient was brought to the operating room. After induction of general anesthesia, the patient was positioned on the operative table in the supine position. All pressure points were meticulously padded. Skin incision was then marked out and prepped and draped in the usual sterile fashion.  After timeout was conducted, the previous skin incision was opened. The single piece myocutaneous flap was then elevated and retracted anteriorly. The incision was extended posteriorly above the parietal lobe in preparation for an enlarged craniotomy. The previous craniotomy was then removed. The previously placed gelfoam sheet was seen, without any significant hematoma epidurally. This was removed and the previous dural incisions opened. Some subacute subdural blood was encountered, which was under no pressure. After this was irrigated, A thorough inspection was carried out over the parietal lobe. A very small loculated pocket was seen close to the  midline which was opened with nerve hook and bipolar. The membrane overlying the brain was opened in all directions and the edges cauterized. I was able to visualize all the way back to the posterior parietal skull and no further subdural hematoma was seen.   At this point the wound was irrigated with copious amounts of normal saline irrigation totaling more than 2L. Irrigation ran clear and no further subdural hematoma was seen. Good hemostasis was confirmed on the brain surface. The dura was then closed using a combination of interrupted 4-0 Nurolon stitches. A small piece of DuraGen was then placed over the dural defect. Craniotomy flap was then replaced. Muscle was then closed using interrupted 0 Vicryl stitches, and the galea was closed using interrupted 3-0 Vicryl sutures. The skin was closed using standard surgical skin staples. Sterile dressing was then applied. The patient was then transferred to the stretcher and taken to the ICU in stable hemodynamic condition.  At the end of the case all sponge, needle, and instrument counts were correct.

## 2013-10-23 NOTE — Clinical Social Work Note (Signed)
Clinical Social Worker continuing to follow patient and family for support and discharge planning needs.  CSW received notification from RN while patient in surgery that patient niece is exploring SN[place] options [place].  Patient niece [facility]able to [facility].  CSW to follow up with patient and patient niece regarding placement options.  CSW remains available for support and discharge planning needs.  Macario GoldsJesse Jlen Wintle, [place] [phone]

## 2013-10-23 NOTE — Progress Notes (Signed)
UR completed. Back to OR today.   Carlyle LipaMichelle Aviance [place]wood, RN BSN MHA CCM Trauma/Neuro ICU Case Manager [phone]

## 2013-10-23 NOTE — Anesthesia Procedure Notes (Signed)
Procedure Name: Intubation Date/Time: 10/23/2013 7:38 AM Performed by: Elon AlasLEE, Arnet Hofferber BROWN Pre-anesthesia Checklist: Patient identified, Timeout performed, Emergency Drugs available, Suction available and Patient being monitored Patient Re-evaluated:Patient Re-evaluated prior to inductionOxygen Delivery Method: Circle system utilized Preoxygenation: Pre-oxygenation with 100% oxygen Intubation Type: IV induction Ventilation: Mask ventilation without difficulty Laryngoscope Size: Mac and 4 Grade View: Grade III Tube type: Oral Tube size: 7.5 mm Number of attempts: 1 Airway Equipment and Method: Stylet Placement Confirmation: CO2 detector,  positive ETCO2,  ETT inserted through vocal cords under direct vision and breath sounds checked- equal and bilateral Secured at: 23 cm Tube secured with: Tape Dental Injury: Teeth and Oropharynx as per pre-operative assessment

## 2013-10-23 NOTE — Transfer of Care (Signed)
Immediate Anesthesia Transfer of Care Note  Patient: Arthur Bailey  Procedure(s) Performed: Procedure(s): LEFT CRANIOTOMY FOR HYGROMA EVACUATION (Left)  Patient Location: PACU  Anesthesia Type:General  Level of Consciousness: sedated and responds to stimulation  Airway & Oxygen Therapy: Patient Spontanous Breathing and Patient connected to nasal cannula oxygen  Post-op Assessment: Report given to PACU RN, Post -op Vital signs reviewed and stable and Patient moving all extremities X 4  Post vital signs: Reviewed and stable  Complications: No apparent anesthesia complications

## 2013-10-23 NOTE — Anesthesia Preprocedure Evaluation (Addendum)
Anesthesia Evaluation  Patient identified by MRN, date of birth, ID band Patient awake    Reviewed: Allergy & Precautions, H&P , NPO status , Patient's Chart, lab work & pertinent test results  Airway Mallampati: II  Neck ROM: full    Dental  (+) Dental Advisory Given   Pulmonary Current Smoker,          Cardiovascular hypertension,     Neuro/Psych H/o SDH s/p recent evacuation. CVA    GI/Hepatic   Endo/Other  diabetes, Type 2  Renal/GU      Musculoskeletal   Abdominal   Peds  Hematology   Anesthesia Other Findings   Reproductive/Obstetrics                          Anesthesia Physical Anesthesia Plan  ASA: II  Anesthesia Plan: General   Post-op Pain Management:    Induction: Intravenous  Airway Management Planned: Oral ETT  Additional Equipment: Arterial line  Intra-op Plan:   Post-operative Plan: Extubation in OR  Informed Consent: I have reviewed the patients History and Physical, chart, labs and discussed the procedure including the risks, benefits and alternatives for the proposed anesthesia with the patient or authorized representative who has indicated his/her understanding and acceptance.     Plan Discussed with: CRNA, Anesthesiologist and Surgeon  Anesthesia Plan Comments:         Anesthesia Quick Evaluation

## 2013-10-23 NOTE — Anesthesia Postprocedure Evaluation (Signed)
Anesthesia Post Note  Patient: Arthur Bailey  Procedure(s) Performed: Procedure(s) (LRB): LEFT CRANIOTOMY FOR HYGROMA EVACUATION (Left)  Anesthesia type: General  Patient location: PACU  Post pain: Pain level controlled and Adequate analgesia  Post assessment: Post-op Vital signs reviewed, Patient's Cardiovascular Status Stable, Respiratory Function Stable, Patent Airway and Pain level controlled  Last Vitals:  Filed Vitals:   10/23/13 1617  BP: 152/55  Pulse:   Temp:   Resp:     Post vital signs: Reviewed and stable  Level of consciousness: awake, alert  and oriented  Complications: No apparent anesthesia complications

## 2013-10-24 LAB — GLUCOSE, CAPILLARY
GLUCOSE-CAPILLARY: 176 mg/dL — AB (ref 70–99)
GLUCOSE-CAPILLARY: 197 mg/dL — AB (ref 70–99)
Glucose-Capillary: 150 mg/dL — ABNORMAL HIGH (ref 70–99)
Glucose-Capillary: 267 mg/dL — ABNORMAL HIGH (ref 70–99)

## 2013-10-24 NOTE — Plan of Care (Signed)
Problem: Consults Goal: Diabetes Guidelines if Diabetic/Glucose > 140 If diabetic or lab glucose is > 140 mg/dl - Initiate Diabetes/Hyperglycemia Guidelines & Document Interventions  Outcome: Completed/Met Date Met:  10/24/13 Diabetes educator, Raoul Pitch, RN, reviewed patient's chart on 10/19/13 and put in recommendations for blood glucose control. [place], [place]

## 2013-10-24 NOTE — Progress Notes (Signed)
Occupational Therapy Treatment Patient Details Name: Arthur Bailey MRN: [id number] DOB: 1941-02-22 Today's Date: 10/24/2013    History of present illness Patient is a 73 y.o. male presenting with weakness and altered mental status. CT Head revealed acute to subacute left frontal subdural hematoma measuring up to 2.5 cm, with mass effect on the left frontal lobe and 7 mm left to right midline shift. Pt underwent craniotomy for evacuation 7/26. Re-evacuation performed on 10/23/13    OT comments  Pt demonstrating improvement during today's session. Pt with increased spontaneous movement RUE in flexor synergy pattern. Tone appears decreased RUE. No active movement observed R hand. Increased awareness R side, however, pt continues with R neglect. Significant attentional deficits, requiring mod vc and occasional tactile cues to initiate activity. Educated staff of need to have full S during meals due to pocketing food R cheek and poor awareness. Will continue to follow.   Follow Up Recommendations  SNF;Supervision/Assistance - 24 hour    Equipment Recommendations  Other (comment)    Recommendations for Other Services      Precautions / Restrictions Precautions Precautions: Fall Precaution Comments: R neglect       Mobility Bed Mobility    Transfers    Balance                 ADL Overall ADL's : Needs assistance/impaired Eating/Feeding: Moderate assistance;Sitting;Cueing for safety Eating/Feeding Details (indicate cue type and reason): focus of session on self feeding. Pt required mod vc and occasional tactile cues to feed self. At times required utensil to be loaded, tapped L hand to bring hand to mouth. Pt pocketing food R cheek and required manual assist at times to clear R cheek. Improved management of food when pt taking smaller bites followed by liquid. Sign posted. CNA educated                                   General ADL Comments: /focus of session on  feeding. see above      Vision                 Additional Comments: Appears to demonstrate R field cut. Visual inattention   Perception     Praxis  deficits with initiation; motor impersistence    Cognition   Behavior During Therapy: Flat affect Overall Cognitive Status: Impaired/Different from baseline Area of Impairment: Orientation;Attention;Memory;Following commands;Safety/judgement;Awareness;Problem solving Orientation Level: Disoriented to;Time Current Attention Level: Sustained Memory: Decreased recall of precautions;Decreased short-term memory  Following Commands: Follows one step commands with increased time Safety/Judgement: Decreased awareness of safety;Decreased awareness of deficits Awareness: Emergent Problem Solving: Slow processing;Decreased initiation;Difficulty sequencing;Requires verbal cues;Requires tactile cues General Comments: Cognitive deficits improved since last surgery. Significant attentional deficits. Pt unable to follow command after 20 sec delay. REquired tactile cue to follow command to turn on TV. VC to pick up fork to feed self    Extremity/Trunk Assessment     Increased spontaneous movement RUE. Moving in flexor synergy pattern. Unable to move out of pattern. Increased spasticity proximally. Increased awareness R side.           Exercises     Shoulder Instructions       General Comments      Pertinent Vitals/ Pain       VSS. No c.o pain  Home Living  Prior Functioning/Environment              Frequency Min 2X/week     Progress Toward Goals  OT Goals(current goals can now be found in the care plan section)  Progress towards OT goals: Progressing toward goals  Acute Rehab OT Goals Patient Stated Goal: to go home OT Goal Formulation: With patient Time For Goal Achievement: 11/01/13 Potential to Achieve Goals: Good ADL Goals Pt Will Perform Eating: with  min guard assist;sitting Pt Will Perform Grooming: with min guard assist;standing;sitting Pt Will Transfer to Toilet: with min assist;bedside commode;stand pivot transfer Pt Will Perform Toileting - Clothing Manipulation and hygiene: with min assist;sit to/from stand Additional ADL Goal #1: Pt will direct caregiver in proper position of RUe on pillow with all joints supported  with min A Additional ADL Goal #2: Pt will interact with his enviroment on the R with ADL activity with min A and VC  Plan Discharge plan remains appropriate    Co-evaluation                 End of Session     Activity Tolerance Patient tolerated treatment well   Patient Left in chair;with call bell/phone within reach   Nurse Communication Mobility status;Other (comment) (safety with meals; full supervision)        Time: [id number] OT Time Calculation (min): 30 min  Charges: OT General Charges $OT Visit: 1 Procedure OT Treatments $Self Care/Home Management : 23-37 mins  Cowan Pilar,HILLARY 10/24/2013, 12:45 PM   [facility] Abdoulie Tierce, OTR/L  [phone] 10/24/2013

## 2013-10-24 NOTE — Progress Notes (Signed)
Physical Therapy Treatment Patient Details Name: Arthur PineJames E XXX Rising MRN: [id number] DOB: [birth year] Today's Date: 10/24/2013    History of Present Illness Patient is a 73 y.o. male presenting with weakness and altered mental status. CT Head revealed acute to subacute left frontal subdural hematoma measuring up to 2.5 cm, with mass effect on the left frontal lobe and 7 mm left to right midline shift. Pt underwent craniotomy for evacuation 7/26. Re-evacuation performed on 10/23/13     PT Comments    Patient seen for therapy post re-evacuation. Patient demonstrates improvements in trunk control and today initiated RLE active and voluntary movement (first movement see in RLE since admit).  Patient did require increase cues to attend to task and tactile cues for attention to R side.  Patient performed there ex with active RLE movement and strength at 2-/5.  LLE appears in tact as seen in previous sessions.  Will continue to see and progress activity as tolerated.  Follow Up Recommendations  CIR;Supervision/Assistance - 24 hour     Equipment Recommendations   (tbd)    Recommendations for Other Services       Precautions / Restrictions Precautions Precautions: Fall Restrictions Weight Bearing Restrictions: No    Mobility  Bed Mobility Overal bed mobility: Needs Assistance;+2 for physical assistance Bed Mobility: Supine to Sit;Sit to Supine     Supine to sit: Max assist Sit to supine: +2 for physical assistance;Max assist      Transfers Overall transfer level: Needs assistance Equipment used:  (face to face with gait belt and chuck pad)   Sit to Stand: Max assist;+2 physical assistance Stand pivot transfers: Max assist;+2 physical assistance       General transfer comment: Patient required assist to elevate to standing, cues for upright posture, patient unable to initiate strides to chair but could take some weight through BLEs  Ambulation/Gait                  Stairs            Wheelchair Mobility    Modified Rankin (Stroke Patients Only)       Balance Overall balance assessment: Needs assistance Sitting-balance support: Single extremity supported;Feet supported Sitting balance-Leahy Scale: Poor Sitting balance - Comments: brief periods of self support, other wise minimal assist for stability Postural control: Posterior lean;Right lateral lean Standing balance support: Bilateral upper extremity supported Standing balance-Leahy Scale: Zero                      Cognition Arousal/Alertness: Awake/alert Behavior During Therapy: WFL for tasks assessed/performed;Flat affect Overall Cognitive Status: Impaired/Different from baseline Area of Impairment: Orientation;Attention;Following commands;Safety/judgement;Awareness;Problem solving Orientation Level: Disoriented to;Time;Situation Current Attention Level: Focused Memory: Decreased short-term memory Following Commands: Follows one step commands inconsistently;Follows one step commands with increased time Safety/Judgement: Decreased awareness of safety;Decreased awareness of deficits Awareness: Intellectual Problem Solving: Slow processing;Decreased initiation;Difficulty sequencing;Requires verbal cues;Requires tactile cues General Comments: Patient with continued disorientation to time and situation. Appears motivated today but continues to require max tactile cues for attnetion to carry out tasks.    Exercises      General Comments General comments (skin integrity, edema, etc.): continued to work on trunk control with some improvements noted today, patient with less posterior lean, able to hold himself EOB for breif periods, patient also able to perform some active ROM BLEs (long arc quads limited range on right -first evident active movement in right since admit)      Pertinent  Vitals/Pain VSS, no pain, NAD    Home Living                      Prior  Function            PT Goals (current goals can now be found in the care plan section) Acute Rehab PT Goals PT Goal Formulation: With patient Time For Goal Achievement: 10/31/13 Potential to Achieve Goals: Fair Progress towards PT goals: Progressing toward goals    Frequency  Min 3X/week    PT Plan Current plan remains appropriate    Co-evaluation             End of Session Equipment Utilized During Treatment: Gait belt Activity Tolerance: Patient tolerated treatment well;Patient limited by fatigue Patient left: in chair;with call bell/phone within reach     Time: 0935-0959 PT Time Calculation (min): 24 min  Charges:  $Therapeutic Activity: 23-37 mins                    G CodesFabio Asa 2013/11/09, 10:11 AM Charlotte Crumb, PT DPT  [phone]

## 2013-10-24 NOTE — Plan of Care (Signed)
Problem: Phase I Progression Outcomes Goal: OOB as tolerated unless otherwise ordered Outcome: Completed/Met Date Met:  10/24/13 Patient worked with PT today at [facility] and positioned in chair. Goal: Initial discharge plan identified Outcome: Completed/Met Date Met:  10/24/13 CSW working with family to find SNF in [place] for patient after discharge.  Dr. Kathyrn Sheriff wants to revisit [facility] as possibility.

## 2013-10-24 NOTE — Progress Notes (Signed)
Speech Language Pathology Treatment: Cognitive-Linquistic  Patient Details Name: Arthur Bailey MRN: [id number] DOB: 09-18-1940 Today's Date: 10/24/2013 Time: [id number] SLP Time Calculation (min): 14 min  Assessment / Plan / Recommendation Clinical Impression  Continued f/u for cognitive/linguistic tx.  Pt participatory, confabulatory with discussion, confusing past and present.  Max cues required for orientation to elements of time.  Max cues to identify deficits; poor insight persists.  Improving attention to left; max assist for recall of events of day, therapies received.  Continue SLP f/u.  Spoke with OT, who observed oral pocketing on right side during meal.  If safety with POs needs further evaluation, please order SLP swallow.      HPI HPI: Patient is a 73 y.o. male presenting with weakness and altered mental status. CT Head revealed acute to subacute left frontal subdural hematoma measuring up to 2.5 cm, with mass effect on the left frontal lobe and 7 mm left to right midline shift. Pt underwent craniotomy for evacuation 7/26.      SLP Plan  Continue with current plan of care    Recommendations               Follow up Recommendations: Skilled Nursing facility;24 hour supervision/assistance Plan: Continue with current plan of care        Arthur Bailey, [place] CCC/SLP Pager [phone]  Arthur Bailey, Arthur Bailey 10/24/2013, 4:33 PM

## 2013-10-24 NOTE — Plan of Care (Signed)
Problem: Consults Goal: Diagnosis - Craniotomy Outcome: Completed/Met Date Met:  10/24/13 Subdural hematoma

## 2013-10-24 NOTE — Clinical Social Work Note (Signed)
Clinical Social Worker continuing to follow patient and family for support and discharge planning needs.  CSW spoke with patient niece over the phone who states that she feels that patient would benefit more from local placement than moving him to [place].  CSW provided patient niece with bed offers over the phone and she plans to do some research and provide top two choices.  Patient niece feels that if patient does not progress over a period of time she may transition him to [place], however at this time she would like to keep him close to his home.    Clinical Social Worker called inpatient rehab admissions coordinator to discuss patient case regarding possible CIR placement.  Inpatient rehab admissions coordinator states that due to patient lack of caregiver support and potential need for SNF following rehab stay he is likely not a candidate.  Inpatient rehab admissions coordinator to further explore with rehab MD regarding the possibilities.  CSW has established SNF plan with patient family in the event that inpatient rehab remains unable to admit patient.  CSW remains available for support and to facilitate patient discharge needs once medically ready.  Macario GoldsJesse Arthi Mcdonald, [place] [phone]

## 2013-10-24 NOTE — Progress Notes (Signed)
Pt seen and examined. No issues overnight. Pt has no complaints.  EXAM: Temp:  [97.6 F (36.4 C)-100.4 F (38 C)] 98.6 F (37 C) (08/04 0800) Pulse Rate:  [53-94] 74 (08/04 1000) Resp:  [13-26] 15 (08/04 1000) BP: (135-198)/(41-105) 141/63 mmHg (08/04 1000) SpO2:  [94 %-100 %] 96 % (08/04 1000) Intake/Output     08/03 0701 - 08/04 0700 08/04 0701 - 08/05 0700   P.O. 130    I.V. (mL/kg) 3268.3 (53.8) 300 (4.9)   Other     Total Intake(mL/kg) 3398.3 (56) 300 (4.9)   Urine (mL/kg/hr) 2060 (1.4)    Total Output 2060     Net +1338.3 +300        Urine Occurrence 2 x     Awake, alert, oriented Speech fluent Tracks/looks to right Recognizes weakness on right 2/5 proximal RUE 1/5 proximal RLE Good strength LUE./LLE Headwrap in place  LABS: Lab Results  Component Value Date   CREATININE 0.60 10/22/2013   BUN 10 10/22/2013   NA 138 10/22/2013   K 4.0 10/22/2013   CL 103 10/22/2013   CO2 23 10/22/2013   Lab Results  Component Value Date   WBC 6.0 10/22/2013   HGB 12.6* 10/22/2013   HCT 38.2* 10/22/2013   MCV 82.5 10/22/2013   PLT 279 10/22/2013    IMPRESSION: - 73 y.o. male POD#2 s/p redo crani for chronic SDH - Right neglect/weakness is improving  PLAN: - Cont current supportive care - PT/OT/SLP - [place] - May get PMR for re-eval for CIR

## 2013-10-25 LAB — GLUCOSE, CAPILLARY
GLUCOSE-CAPILLARY: 192 mg/dL — AB (ref 70–99)
GLUCOSE-CAPILLARY: 141 mg/dL — AB (ref 70–99)
Glucose-Capillary: 227 mg/dL — ABNORMAL HIGH (ref 70–99)
Glucose-Capillary: 231 mg/dL — ABNORMAL HIGH (ref 70–99)

## 2013-10-25 MED ORDER — BISACODYL 10 MG RE SUPP
10.0000 mg | Freq: Every day | RECTAL | Status: DC | PRN
  Administered 2013-10-25: 10 mg via RECTAL
  Filled 2013-10-25: qty 1

## 2013-10-25 MED ORDER — POLYETHYLENE GLYCOL 3350 17 G PO PACK
17.0000 g | PACK | Freq: Every day | ORAL | Status: DC | PRN
  Administered 2013-10-25: 17 g via ORAL
  Filled 2013-10-25 (×2): qty 1
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
## 2013-10-25 NOTE — Progress Notes (Signed)
Physical Therapy Treatment Patient Details Name: Arthur Bailey MRN: [id number] DOB: 1940-09-29 Today's Date: 10/25/2013    History of Present Illness Patient is a 73 y.o. male presenting with weakness and altered mental status. CT Head revealed acute to subacute left frontal subdural hematoma measuring up to 2.5 cm, with mass effect on the left frontal lobe and 7 mm left to right midline shift. Pt underwent craniotomy for evacuation 7/26. Re-evacuation performed on 10/23/13     PT Comments    Patient demonstrates improvements in cognition, balance and function this session. Patient with increased functional use of RLE and tolerated ambulation across the room with +2 assist. Will continue to see and progress activity as tolerated.   Follow Up Recommendations  CIR;Supervision/Assistance - 24 hour     Equipment Recommendations   (tbd)    Recommendations for Other Services       Precautions / Restrictions Precautions Precautions: Fall Precaution Comments: R neglect (improving)    Mobility  Bed Mobility Overal bed mobility: Needs Assistance;+2 for physical assistance Bed Mobility: Supine to Sit;Sit to Supine     Supine to sit: Mod assist     General bed mobility comments: Patient able to actively initiate RLE movement to EOB, patient also able to assist with scooting EOB and self supporting   Transfers Overall transfer level: Needs assistance Equipment used: Rolling walker (2 wheeled) Transfers: Sit to/from [employer] Transfers Sit to Stand: Max assist;+2 physical assistance Stand pivot transfers: Max assist;+2 physical assistance       General transfer comment: Patient with improvements in functional strength   Ambulation/Gait Ambulation/Gait assistance: Max assist;+2 physical assistance Ambulation Distance (Feet): 16 Feet Assistive device:  (two person HHA with 3 musketeer approach) Gait Pattern/deviations: Step-to pattern;Decreased step length -  right;Decreased stride length;Decreased weight shift to right;Scissoring;Narrow base of support     General Gait Details: Patient with increased use of RLE, approved mobility and initiation of steps upon command   Stairs            Wheelchair Mobility    Modified Rankin (Stroke Patients Only)       Balance Overall balance assessment: Needs assistance Sitting-balance support: Single extremity supported Sitting balance-Leahy Scale: Fair Sitting balance - Comments: improved EOB balance, extended periods of time without assist   Standing balance support: Bilateral upper extremity supported Standing balance-Leahy Scale: Zero                      Cognition Arousal/Alertness: Awake/alert Behavior During Therapy: WFL for tasks assessed/performed;Flat affect Overall Cognitive Status: Impaired/Different from baseline Area of Impairment: Orientation;Attention;Following commands;Safety/judgement;Awareness;Problem solving Orientation Level: Disoriented to;Time;Situation Current Attention Level: Sustained Memory: Decreased short-term memory Following Commands: Follows one step commands consistently;Follows one step commands with increased time Safety/Judgement: Decreased awareness of safety;Decreased awareness of deficits Awareness: Intellectual Problem Solving: Slow processing;Decreased initiation;Difficulty sequencing;Requires verbal cues;Requires tactile cues General Comments: improvements in ability to follow commands today. improved attention to Right side and increased active mobility and use of the R side.    Exercises      General Comments        Pertinent Vitals/Pain VSS, patient reports no pain at this time    Home Living                      Prior Function            PT Goals (current goals can now be found in the care plan section)  Acute Rehab PT Goals Patient Stated Goal: to go home PT Goal Formulation: With patient Time For Goal  Achievement: 10/31/13 Potential to Achieve Goals: Fair Progress towards PT goals: Progressing toward goals    Frequency  Min 3X/week    PT Plan Current plan remains appropriate    Co-evaluation             End of Session Equipment Utilized During Treatment: Gait belt Activity Tolerance: Patient tolerated treatment well;Patient limited by fatigue Patient left: in chair;with call bell/phone within reach     Time: 1021-1044 PT Time Calculation (min): 23 min  Charges:  $Therapeutic Activity: 23-37 mins                    G CodesFabio Asa:      Faria Casella J 10/25/2013, 12:29 PM Charlotte Crumbevon Kerry Chisolm, PT DPT  [phone]

## 2013-10-25 NOTE — Progress Notes (Signed)
Pt seen and examined. No issues overnight. Pt doing well, reports his right side is "getting better." No HA.  EXAM: Temp:  [97.8 F (36.6 C)-99.6 F (37.6 C)] 98.6 F (37 C) (08/05 0800) Pulse Rate:  [71-91] 89 (08/05 0800) Resp:  [14-25] 19 (08/05 0800) BP: (129-179)/(51-96) 145/61 mmHg (08/05 0800) SpO2:  [95 %-99 %] 96 % (08/05 0800) Intake/Output     08/04 0701 - 08/05 0700 08/05 0701 - 08/06 0700   P.O. 390    I.V. (mL/kg) 2400 (39.5) 100 (1.6)   Total Intake(mL/kg) 2790 (46) 100 (1.6)   Urine (mL/kg/hr) 2850 (2) 375 (2.2)   Total Output 2850 375   Net -60 -275        Urine Occurrence 2 x     Awake, alert, oriented Speech fluent 2/5 proximal RUE, 3/5 proximal RLE Good strength LUE/LLE Wound c/d/i  LABS: Lab Results  Component Value Date   CREATININE 0.60 10/22/2013   BUN 10 10/22/2013   NA 138 10/22/2013   K 4.0 10/22/2013   CL 103 10/22/2013   CO2 23 10/22/2013   Lab Results  Component Value Date   WBC 6.0 10/22/2013   HGB 12.6* 10/22/2013   HCT 38.2* 10/22/2013   MCV 82.5 10/22/2013   PLT 279 10/22/2013    IMPRESSION: - 73 y.o. 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

## 2013-10-26 ENCOUNTER — Encounter (HOSPITAL_COMMUNITY): Payer: Self-pay | Admitting: Neurosurgery

## 2013-10-26 ENCOUNTER — Inpatient Hospital Stay (HOSPITAL_COMMUNITY): Payer: Medicare Other

## 2013-10-26 LAB — GLUCOSE, CAPILLARY
GLUCOSE-CAPILLARY: 136 mg/dL — AB (ref 70–99)
Glucose-Capillary: 200 mg/dL — ABNORMAL HIGH (ref 70–99)
Glucose-Capillary: 234 mg/dL — ABNORMAL HIGH (ref 70–99)
Glucose-Capillary: 145 mg/dL — ABNORMAL HIGH (ref 70–99)

## 2013-10-26 NOTE — Progress Notes (Signed)
Inpatient Diabetes Program Recommendations  AACE/ADA: New Consensus Statement on Inpatient Glycemic Control (2013)  Target Ranges:  Prepandial:   less than 140 mg/dL      Peak postprandial:   less than 180 mg/dL (1-2 hours)      Critically ill patients:  140 - 180 mg/dL  Results for Mignon PineXXX Tramell, Dashiell E (MRN [id number]) as of 10/26/2013 13:20  Ref. Range 10/25/2013 12:21 10/25/2013 16:51 10/25/2013 22:12 10/26/2013 08:02 10/26/2013 12:35  Glucose-Capillary Latest Range: 70-99 mg/dL 657227 (H) 846141 (H) 962231 (H) 200 (H) 234 (H)   Consider adding Lantus 5 units.  Thank you  Piedad ClimesGina Marialena Wollen BSN, RN,CDE Inpatient Diabetes Coordinator [phone] (team pager)

## 2013-10-26 NOTE — Progress Notes (Signed)
Pt arrived on unit.  Oriented and settled into his room.  VSS, assessment complete. Will continue to monitor. Sondra ComeSilva, Claudean Leavelle M, RN 10/26/2013 5:58 PM

## 2013-10-26 NOTE — Progress Notes (Signed)
Occupational Therapy Treatment Patient Details Name: Arthur Bailey MRN: [id number] DOB: [birth year] Today's Date: 10/26/2013    History of present illness Patient is a 73 y.o. male presenting with weakness and altered mental status. CT Head revealed acute to subacute left frontal subdural hematoma measuring up to 2.5 cm, with mass effect on the left frontal lobe and 7 mm left to right midline shift. Pt underwent craniotomy for evacuation 7/26. Re-evacuation performed on 10/23/13    OT comments  Apparent decline in function today. Decreased AROM RUE. Increased tone. Increased confusion. Poor postural control Unable to sit unsupported in comparison to yesterday session with PT. Unable to attend to functional task without multimodal cues. nsg and MD made aware. Continue to follow.  Follow Up Recommendations  SNF;Supervision/Assistance - 24 hour    Equipment Recommendations       Recommendations for Other Services      Precautions / Restrictions Precautions Precautions: Fall Precaution Comments: R neglect       Mobility Bed Mobility                  Transfers Overall transfer level: Needs assistance   Transfers: Sit to/from Stand Sit to Stand: Total assist;+2 physical assistance         General transfer comment: decreased postural control today    Balance Overall balance assessment: Needs assistance Sitting-balance support: Feet supported;Single extremity supported Sitting balance-Leahy Scale: Zero Sitting balance - Comments: unable to sit unsupported Postural control: Posterior lean Standing balance support: During functional activity;Bilateral upper extremity supported Standing balance-Leahy Scale: Zero Standing balance comment: unable to achieve full upright posture;posterior lean                   ADL Overall ADL's : Needs assistance/impaired     Grooming: Maximal assistance Grooming Details (indicate cue type and reason): unable to initiate  today. required tactile cues to initiate. Unable to intiate brushing teeth. unable to begin to sequence task                             Functional mobility during ADLs: Total assistance;+2 for physical assistance (sit - stand only) General ADL Comments: decrease attention to ADL tasks. increased cues required      Vision                     Perception     Praxis      Cognition   Behavior During Therapy: Restless Overall Cognitive Status: Impaired/Different from baseline Area of Impairment: Orientation;Attention;Following commands;Safety/judgement;Awareness;Problem solving Orientation Level: Disoriented to;Place;Time;Situation ("at home") Current Attention Level: Focused Memory: Decreased short-term memory  Following Commands: Follows one step commands with increased time Safety/Judgement: Decreased awareness of safety;Decreased awareness of deficits Awareness: Intellectual Problem Solving: Slow processing;Decreased initiation;Difficulty sequencing;Requires verbal cues;Requires tactile cues General Comments: more distracted today. increased time and multimodal cue needs to follow commands today    Extremity/Trunk Assessment               Exercises     Shoulder Instructions       General Comments      Pertinent Vitals/ Pain       VSS. No c/o pain  Home Living  Prior Functioning/Environment              Frequency Min 2X/week     Progress Toward Goals  OT Goals(current goals can now be found in the care plan section)  Progress towards OT goals: Progressing toward goals  Acute Rehab OT Goals Patient Stated Goal: to go home OT Goal Formulation: With patient Time For Goal Achievement: 11/01/13 Potential to Achieve Goals: Good ADL Goals Pt Will Perform Eating: with min guard assist;sitting Pt Will Perform Grooming: with min guard assist;standing;sitting Pt Will Transfer to  Toilet: with min assist;bedside commode;stand pivot transfer Pt Will Perform Toileting - Clothing Manipulation and hygiene: with min assist;sit to/from stand Additional ADL Goal #1: Pt will direct caregiver in proper position of RUe on pillow with all joints supported  with min A Additional ADL Goal #2: Pt will interact with his enviroment on the R with ADL activity with min A and VC  Plan Discharge plan remains appropriate    Co-evaluation                 End of Session Equipment Utilized During Treatment: Gait belt   Activity Tolerance Patient tolerated treatment well   Patient Left in chair;with call bell/phone within reach   Nurse Communication Mobility status;Other (comment) (concnerns regarding increased confusion/cognition/mobility)        Time: 1610-96041201-1229 OT Time Calculation (min): 28 min  Charges: OT General Charges $OT Visit: 1 Procedure OT Treatments $Self Care/Home Management : 8-22 mins  Kalene Cutler,HILLARY 10/26/2013, 12:41 PM   [facility] Shamiyah Ngu, OTR/L  [phone] 10/26/2013

## 2013-10-26 NOTE — Progress Notes (Signed)
Physical Therapy Treatment Patient Details Name: Arthur Bailey MRN: [id number] DOB: [birth year] Today's Date: 10/26/2013    History of Present Illness Patient is a 73 y.o. male presenting with weakness and altered mental status. CT Head revealed acute to subacute left frontal subdural hematoma measuring up to 2.5 cm, with mass effect on the left frontal lobe and 7 mm left to right midline shift. Pt underwent craniotomy for evacuation 7/26. Re-evacuation performed on 10/23/13     PT Comments    Patient seen for attempts to ambulate today. Patient appears with decline in function compared to previous session. Patient with increased confusion, decreased active ROM in right side, decreased attention to tasks, and decline in balance compared to prior session. MD made aware. Will continue to see and progress as tolerated.   Follow Up Recommendations  CIR;Supervision/Assistance - 24 hour     Equipment Recommendations   (tbd)    Recommendations for Other Services       Precautions / Restrictions Precautions Precautions: Fall Precaution Comments: R neglect    Mobility  Bed Mobility                  Transfers Overall transfer level: Needs assistance   Transfers: Sit to/from Stand Sit to Stand: Total assist;+2 physical assistance         General transfer comment: decreased postural control today  Ambulation/Gait             General Gait Details: attempted ambualtion x2, unable to physically perform today, patient with increased deficits compared to prior session, MD aware   Stairs            Wheelchair Mobility    Modified Rankin (Stroke Patients Only)       Balance Overall balance assessment: Needs assistance Sitting-balance support: Feet supported;Single extremity supported Sitting balance-Leahy Scale: Zero Sitting balance - Comments: unable to sit unsupported Postural control: Posterior lean Standing balance support: During functional  activity;Bilateral upper extremity supported Standing balance-Leahy Scale: Zero Standing balance comment: unable to achieve full upright posture;posterior lean                    Cognition Arousal/Alertness: Awake/alert Behavior During Therapy: Restless Overall Cognitive Status: Impaired/Different from baseline Area of Impairment: Orientation;Attention;Following commands;Safety/judgement;Awareness;Problem solving Orientation Level: Disoriented to;Place;Time;Situation ("at home") Current Attention Level: Focused Memory: Decreased short-term memory Following Commands: Follows one step commands with increased time Safety/Judgement: Decreased awareness of safety;Decreased awareness of deficits Awareness: Intellectual Problem Solving: Slow processing;Decreased initiation;Difficulty sequencing;Requires verbal cues;Requires tactile cues General Comments: more distracted today. increased time and multimodal cue needs to follow commands today    Exercises      General Comments        Pertinent Vitals/Pain      Home Living                      Prior Function            PT Goals (current goals can now be found in the care plan section) Acute Rehab PT Goals Patient Stated Goal: to go home PT Goal Formulation: With patient Time For Goal Achievement: 10/31/13 Potential to Achieve Goals: Fair Progress towards PT goals: Not progressing toward goals - comment (decline since prior session)    Frequency  Min 3X/week    PT Plan Current plan remains appropriate    Co-evaluation             End of Session Equipment Utilized  During Treatment: Gait belt Activity Tolerance: Patient tolerated treatment well;Patient limited by fatigue Patient left: in chair;with call bell/phone within reach     Time: 1201-1229 PT Time Calculation (min): 28 min  Charges:  $Therapeutic Activity: 8-22 mins                    G CodesFabio Asa 11/19/13, 2:03  PM Charlotte Crumb, PT DPT  [phone]

## 2013-10-26 NOTE — Progress Notes (Signed)
Patient ID: Arthur PineJames E XXX Bailey, male   DOB: Aug 07, 1940, 73 y.o.   MRN: [id number] Subjective:  The patient is alert and pleasant. He has no complaints. According to the therapist the patient is a bit worse today.  Objective: Vital signs in last 24 hours: Temp:  [97.5 F (36.4 C)-99.4 F (37.4 C)] 98 F (36.7 C) (08/06 0800) Pulse Rate:  [73-109] 89 (08/06 1100) Resp:  [13-27] 25 (08/06 1100) BP: (116-169)/(46-92) 134/49 mmHg (08/06 1100) SpO2:  [93 %-100 %] 97 % (08/06 1100)  Intake/Output from previous day: 08/05 0701 - 08/06 0700 In: 2900 [P.O.:500; I.V.:2400] Out: 2227 [Urine:2225; Stool:2] Intake/Output this shift: Total I/O In: 600 [P.O.:200; I.V.:400] Out: -   Physical exam the patient is alert and oriented x2, person, [facility]. Glascow Coma scale 14. His pupils are equal . The patient's strength is normal on the left. His right upper extremity is near-plegic. His quadriceps strength is 3/5 on the right. His wound is healing well.  Lab Results: No results found for this basename: WBC, HGB, HCT, PLT,  in the last 72 hours BMET No results found for this basename: NA, K, CL, CO2, GLUCOSE, BUN, CREATININE, CALCIUM,  in the last 72 hours  Studies/Results: No results found.  Assessment/Plan: Postop day #12: This is the first time I've seen the patient but according to the therapist is a bit worse. I want to get a head scan.  LOS: 12 days     Jamison Soward D 10/26/2013, 12:29 PM

## 2013-10-27 LAB — GLUCOSE, CAPILLARY
GLUCOSE-CAPILLARY: 147 mg/dL — AB (ref 70–99)
GLUCOSE-CAPILLARY: 158 mg/dL — AB (ref 70–99)
GLUCOSE-CAPILLARY: 164 mg/dL — AB (ref 70–99)
Glucose-Capillary: 171 mg/dL — ABNORMAL HIGH (ref 70–99)
Glucose-Capillary: 118 mg/dL — ABNORMAL HIGH (ref 70–99)

## 2013-10-27 MED ORDER — GLUCERNA SHAKE PO LIQD
237.0000 mL | Freq: Two times a day (BID) | ORAL | Status: DC
  Administered 2013-10-27 – 2013-10-30 (×4): 237 mL via ORAL

## 2013-10-27 MED ORDER — ADULT MULTIVITAMIN W/MINERALS CH
1.0000 | ORAL_TABLET | Freq: Every day | ORAL | Status: DC
  Administered 2013-10-27 – 2013-10-30 (×4): 1 via ORAL
  Filled 2013-10-27 (×4): qty 1

## 2013-10-27 NOTE — Progress Notes (Signed)
Patient ID: Mignon PineJames E XXX Federer, male   DOB: 04-Sep-1940, 73 y.o.   MRN: [id number] Subjective:  The patient is alert and pleasant. He has no complaints.  Objective: Vital signs in last 24 hours: Temp:  [97.8 F (36.6 C)-101.6 F (38.7 C)] 97.8 F (36.6 C) (08/07 0959) Pulse Rate:  [50-94] 92 (08/07 0959) Resp:  [18-24] 18 (08/07 0959) BP: (124-168)/(52-74) 168/65 mmHg (08/07 0959) SpO2:  [97 %-100 %] 100 % (08/07 0959)  Intake/Output from previous day: 08/06 0701 - 08/07 0700 In: 1320 [P.O.:320; I.V.:1000] Out: 1950 [Urine:1950] Intake/Output this shift:    Physical exam the patient is alert and oriented. He is right hemiparetic but is a bit stronger than yesterday. His wound is healing well.  Lab Results: No results found for this basename: WBC, HGB, HCT, PLT,  in the last 72 hours BMET No results found for this basename: NA, K, CL, CO2, GLUCOSE, BUN, CREATININE, CALCIUM,  in the last 72 hours  Studies/Results: Ct Head Wo Contrast  10/26/2013   CLINICAL DATA:  Followup craniotomy for subdural hematoma. Continued altered mental status.  EXAM: CT HEAD WITHOUT CONTRAST  TECHNIQUE: Contiguous axial images were obtained from the base of the skull through the vertex without contrast.  COMPARISON:  Most recent CT head 10/16/2013. Most recent MR 10/20/2013.  FINDINGS: The patient underwent repeat craniotomy on 10/23/2013. There is considerable subdural hematoma remaining on the LEFT. Overall there is slight improvement. As measured on image 23 the thickness is 22 mm as compared with 31 mm on the prior CT at the same level. There is moderate compression of the cortex within the LEFT hemisphere but the overall midline shift left-to-right is improved, now measuring 5 mm as opposed to 7 mm previously. No significant RIGHT sided extra-axial collection. Moderate pneumocephalus persists. Craniotomy appears uncomplicated. There is no subdural drain.  IMPRESSION: Significant residual LEFT subdural  hematoma measuring up to 22 mm thick.   Electronically Signed   By: Davonna BellingJohn  Curnes M.D.   On: 10/26/2013 14:37    Assessment/Plan: Left subdural hematoma: The patient's CAT scan yesterday demonstrated some left extra-axial fluid collection. His clinical exam is stable/improving. We will continue observation.  LOS: 13 days     Fredie Majano D 10/27/2013, 12:13 PM

## 2013-10-27 NOTE — Progress Notes (Signed)
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  Max cues for orientation to place and situation.  Max assist to set-up meal tray for lunch, mod cues to initiate motor response to prepare his iced tea and add condiments to food.  Output confabulatory, with max cues to redirect for topic maintenance and sustain attention to meal.  Poor awareness persists.  Pt requires full assist with meals given inattention and high distractibility.     HPI HPI: Patient is a 73 y.o. male presenting with weakness and altered mental status. CT Head revealed acute to subacute left frontal subdural hematoma measuring up to 2.5 cm, with mass effect on the left frontal lobe and 7 mm left to right midline shift. Pt underwent craniotomy for evacuation 7/26.   Pertinent Vitals Pain Assessment: No/denies pain  SLP Plan  Continue with current plan of care    Recommendations                Follow up Recommendations: Skilled Nursing facility;24 hour supervision/assistance Plan: Continue with current plan of care    GO     Arthur Bailey, Arthur Bailey 10/27/2013, 2:58 PM  Arthur Bailey, Arthur Bailey Pager [phone]

## 2013-10-27 NOTE — Progress Notes (Signed)
INITIAL NUTRITION ASSESSMENT  DOCUMENTATION CODES Per approved criteria  -Not Applicable   INTERVENTION: Provide Glucerna Shake BID in between meals Encourage PO intake Provide Multivitamin with minerals daily  NUTRITION DIAGNOSIS: Inadequate oral intake related to medical condition as evidenced by 3% weight loss and moderate muscle to severe wasting.   Goal: Pt to meet >/= 90% of their estimated nutrition needs   Monitor:  PO intake, weight trend, labs  Reason for Assessment: Length of Stay  73 y.o. male  Admitting Dx: <principal problem not specified>  ASSESSMENT: 73 y.o. male presenting with weakness and altered mental status. CT Head revealed acute to subacute left frontal subdural hematoma measuring up to 2.5 cm, with mass effect on the left frontal lobe and 7 mm left to right midline shift. Pt underwent craniotomy for evacuation 7/26.  Pt confused at time of visit. He reports that he used to weigh 142 lbs. He states that his appetite has been good and he has been eating well. Pt's lunch sitting at bedside untouched- pt states he can't eat until he goes to the store. Per nursing notes pt is eating 25-50% of most meals, 75-90% of some. Discussed weight loss with patient and he is agreeable to receiving Glucerna Shakes to prevent further weight loss and get extra nutrition.   Nutrition Focused Physical Exam:  Subcutaneous Fat:  Orbital Region: wnl Upper Arm Region: wnl Thoracic and Lumbar Region: NA  Muscle:  Temple Region: mild wasting Clavicle Bone Region: wnl Clavicle and Acromion Bone Region: wnl Scapular Bone Region: NA Dorsal Hand: mild wasting Patellar Region: moderate to severe wasting Anterior Thigh Region: moderate wasting Posterior Calf Region: moderate to severe wasting  Edema: none  Height: Ht Readings from Last 1 Encounters:  10/15/13 5\' 8"  (1.727 m)    Weight: Wt Readings from Last 1 Encounters:  10/17/13 133 lb 13.1 oz (60.7 kg)    Ideal  Body Weight: 154 lbs  % Ideal Body Weight: 86%  Wt Readings from Last 10 Encounters:  10/17/13 133 lb 13.1 oz (60.7 kg)  10/17/13 133 lb 13.1 oz (60.7 kg)  10/17/13 133 lb 13.1 oz (60.7 kg)  09/06/13 136 lb 14.4 oz (62.097 kg)    Usual Body Weight: 142 lbs  % Usual Body Weight: 94%  BMI:  Body mass index is 20.35 kg/(m^2).  Estimated Nutritional Needs: Kcal: 1800-2000 Protein: 80-90 grams Fluid: 1.8- 2 L/day  Skin: closed incision on head  Diet Order: Carb Control  EDUCATION NEEDS: -No education needs identified at this time   Intake/Output Summary (Last 24 hours) at 10/27/13 1505 Last data filed at 10/27/13 0700  Gross per 24 hour  Intake    200 ml  Output   1400 ml  Net  -1200 ml    Last BM: 8/6  Labs:   Recent Labs Lab 10/22/13 1110  NA 138  K 4.0  CL 103  CO2 23  BUN 10  CREATININE 0.60  CALCIUM 8.8  GLUCOSE 173*    CBG (last 3)   Recent Labs  10/27/13 0629 10/27/13 0831 10/27/13 1134  GLUCAP 147* 158* 171*    Scheduled Meds: . insulin aspart  0-9 Units Subcutaneous TID WC  . loratadine  10 mg Oral Daily  . pantoprazole  40 mg Oral QHS  . senna-docusate  1 tablet Oral BID    Continuous Infusions: . sodium chloride 100 mL/hr at 10/27/13 0744    Past Medical History  Diagnosis Date  . Diabetes mellitus   .  Hypertension   . Neuropathy     Past Surgical History  Procedure Laterality Date  . Craniotomy Left 10/15/2013    Procedure: CRANIOTOMY HEMATOMA EVACUATION SUBDURAL;  Surgeon: Lisbeth Renshaw, MD;  Location: [facility] OR;  Service: Neurosurgery;  Laterality: Left;  . Craniotomy Left 10/23/2013    Procedure: LEFT CRANIOTOMY FOR HYGROMA EVACUATION;  Surgeon: Lisbeth Renshaw, MD;  Location: [facility] NEURO ORS;  Service: Neurosurgery;  Laterality: Left;    Ian Malkin RD, LDN Inpatient Clinical Dietitian Pager: [phone] After Hours Pager: [phone]

## 2013-10-27 NOTE — Progress Notes (Signed)
IM (Important Message from Medicare explaining patient's of their rights) given to the patient; B Awilda Covin RN,BSN,MHA [phone]  

## 2013-10-28 LAB — BASIC METABOLIC PANEL
Anion gap: 20 — ABNORMAL HIGH (ref 5–15)
BUN: 11 mg/dL (ref 6–23)
CO2: 18 meq/L — AB (ref 19–32)
Calcium: 9.4 mg/dL (ref 8.4–10.5)
Chloride: 96 mEq/L (ref 96–112)
Creatinine, Ser: 0.42 mg/dL — ABNORMAL LOW (ref 0.50–1.35)
GFR calc Af Amer: >90 mL/min (ref >90)
GFR calc non Af Amer: >90 mL/min (ref >90)
GLUCOSE: 137 mg/dL — AB (ref 70–99)
POTASSIUM: 4.4 meq/L (ref 3.7–5.3)
Sodium: 134 mEq/L — ABNORMAL LOW (ref 137–147)

## 2013-10-28 LAB — GLUCOSE, CAPILLARY
GLUCOSE-CAPILLARY: 162 mg/dL — AB (ref 70–99)
Glucose-Capillary: 187 mg/dL — ABNORMAL HIGH (ref 70–99)
Glucose-Capillary: 144 mg/dL — ABNORMAL HIGH (ref 70–99)
Glucose-Capillary: 137 mg/dL — ABNORMAL HIGH (ref 70–99)

## 2013-10-28 NOTE — Progress Notes (Signed)
Patient ID: Arthur Bailey, male   DOB: 05-12-1940, 73 y.o.   MRN: [id number] Afeb, vss. Showing steady progress. Increasing activity. Disposition per Dr Conchita ParisNundkumar

## 2013-10-28 NOTE — Progress Notes (Signed)
Decreased appetite and nausea since yesterday; vomited once today; twice during the night; BMET checked; still decreased appetite today but reports nausea resolving after prn zofran given. Last BM was on 10/26/13 abd. Soft nontender. BMET called to MD.

## 2013-10-29 LAB — GLUCOSE, CAPILLARY
GLUCOSE-CAPILLARY: 188 mg/dL — AB (ref 70–99)
GLUCOSE-CAPILLARY: 164 mg/dL — AB (ref 70–99)
Glucose-Capillary: 185 mg/dL — ABNORMAL HIGH (ref 70–99)
Glucose-Capillary: 135 mg/dL — ABNORMAL HIGH (ref 70–99)

## 2013-10-29 NOTE — Progress Notes (Signed)
Patient ID: Mignon PineJames E XXX Kaufmann, male   DOB: March 18, 1941, 73 y.o.   MRN: [id number] Subjective:  The patient is alert and pleasant. He has no complaints.  Objective: Vital signs in last 24 hours: Temp:  [98 F (36.7 C)-99.3 F (37.4 C)] 99 F (37.2 C) (08/09 0958) Pulse Rate:  [72-100] 88 (08/09 0958) Resp:  [16-20] 18 (08/09 0958) BP: (155-168)/(63-72) 155/72 mmHg (08/09 0958) SpO2:  [95 %-100 %] 100 % (08/09 0958)  Intake/Output from previous day: 08/08 0701 - 08/09 0700 In: -  Out: 650 [Urine:650] Intake/Output this shift: Total I/O In: -  Out: 900 [Urine:900]  Physical exam the patient is alert and oriented x2, person and [facility]. He has right hemiparetic but improving. His incision is healing well.  Lab Results: No results found for this basename: WBC, HGB, HCT, PLT,  in the last 72 hours BMET  Recent Labs  10/28/13 1620  NA 134*  K 4.4  CL 96  CO2 18*  GLUCOSE 137*  BUN 11  CREATININE 0.42*  CALCIUM 9.4    Studies/Results: No results found.  Assessment/Plan: Status post craniotomies for subdural hematoma: The patient is doing well clinically. I will ask the rehabilitation team to see the patient.  LOS: 15 days     Kina Shiffman D 10/29/2013, 10:33 AM

## 2013-10-30 LAB — GLUCOSE, CAPILLARY
Glucose-Capillary: 169 mg/dL — ABNORMAL HIGH (ref 70–99)
Glucose-Capillary: 185 mg/dL — ABNORMAL HIGH (ref 70–99)
Glucose-Capillary: 156 mg/dL — ABNORMAL HIGH (ref 70–99)
Glucose-Capillary: 176 mg/dL — ABNORMAL HIGH (ref 70–99)

## 2013-10-30 MED ORDER — LEVETIRACETAM 500 MG PO TABS: 500.0000 mg | ORAL_TABLET | Freq: Two times a day (BID) | ORAL | Status: DC

## 2013-10-30 NOTE — Progress Notes (Signed)
Patient discharged and leaving for [facility] via [facility]. Catheter removed and patient in a stable condition. All belongings sent with patient on stretcher. Monia PouchShakenna Anaiz Qazi, RN

## 2013-10-30 NOTE — Clinical Social Work Note (Addendum)
CSW received handoff stating pt's niece has chosen [facility] as discharge disposition once pt is medically stable for discharge. CSW contacted pt's niece to confirm discharge disposition. CSW consulted with [facility] admissions lia[facility] regarding discharge. [facility] admissions liaison informed CSW facility is unable to accept pt as pt "seems to be a long-term care" pt versus short-term rehabilitation.  CSW received a call from pt's POA, Drexel IhaCharles Calmer [phone]), regarding pt's discharge disposition. Pt's POA stated he has documentation stating he is pt's POA and is agreeable to SNF placement at time of discharge. CSW informed pt's POA regarding information above with resc[facility]. Pt's POA provided with remaining SNF bed offers. Pt's POA understanding and agreeable of pt's discharge on 10/30/2013 and to provide CSW with bed choice by 3pm on 10/30/2013.  4:12pm UPDATE [facility]. [facility]. Discharge packet is complete and placed on pt's shadow chart. [facility]'s [facility] at 4:30pm to complete admission paperwork. CSW has updated RN regarding information. CSW has arranged transportation via EMS ([facility]).  RN to please call report [facility] at [phone]  Marcelline DeistEmily Kasmira Cacioppo, MSW, [facility] Licensed Clinical Social Worker [phone] and [phone] [phone]
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
## 2013-10-30 NOTE — Progress Notes (Signed)
Physical Therapy Treatment Patient Details Name: Arthur PineJames E XXX Bailey MRN: [id number] DOB: 04/25/1940 Today's Date: 10/30/2013    History of Present Illness 73 y.o. M admitted 10/14/13 presenting with weakness and altered mental status. CT Head revealed acute to subacute left frontal subdural hematoma measuring up to 2.5 cm, with mass effect on the left frontal lobe and 7 mm left to right midline shift. Pt underwent craniotomy for evacuation 7/26. Re-evacuation performed on 10/23/13. PMHx of DM, HTN, and neuropathy.    PT Comments    Pt mobilized 4' with +2 person max assist to chair. Pt with right sided inattention and difficulty sequencing during amb. PT recommends SNF in order to increase pt's strength and independence.  Follow Up Recommendations  SNF;Supervision/Assistance - 24 hour     Equipment Recommendations  Wheelchair (measurements PT);Hospital bed;Wheelchair cushion (measurements PT)       Precautions / Restrictions Precautions Precautions: Fall Precaution Comments: R neglect Restrictions Weight Bearing Restrictions: No    Mobility  Bed Mobility Overal bed mobility: Needs Assistance;+2 for physical assistance Bed Mobility: Rolling;Sidelying to Sit Rolling: Max assist Sidelying to sit: Max assist;HOB elevated       General bed mobility comments: Pt given verbal and tactile cues to reach for R railing with L UE. +2 person max assist for trunk and LE during bed mobility. Pt able to move L LE and minimally with R LE.  Transfers Overall transfer level: Needs assistance Equipment used: 2 person hand held assist Transfers: Sit to/from Stand Sit to Stand: +2 physical assistance;Max assist         General transfer comment: Max +2 person assist to standing.  Ambulation/Gait Ambulation/Gait assistance: Max assist;+2 physical assistance Ambulation Distance (Feet): 4 Feet Assistive device: 2 person hand held assist Gait Pattern/deviations: Step-to  pattern;Scissoring;Decreased step length - right;Decreased stride length;Decreased weight shift to right;Narrow base of support Gait velocity: Decreased Gait velocity interpretation: Below normal speed for age/gender General Gait Details: Pt required max verbal and tactile cues to take a few steps forward. Unable to step backwards toward chair.        Balance Overall balance assessment: Needs assistance Sitting-balance support: Single extremity supported;Feet supported Sitting balance-Leahy Scale: Poor   Postural control: Right lateral lean Standing balance support: Bilateral upper extremity supported;During functional activity Standing balance-Leahy Scale: Zero                      Cognition Arousal/Alertness: Awake/alert Behavior During Therapy: WFL for tasks assessed/performed Overall Cognitive Status: Impaired/Different from baseline Area of Impairment: Attention;Following commands;Safety/judgement;Awareness;Problem solving       Following Commands: Follows one step commands with increased time Safety/Judgement: Decreased awareness of safety;Decreased awareness of deficits   Problem Solving: Requires tactile cues;Requires verbal cues;Difficulty sequencing;Decreased initiation;Slow processing General Comments: Pt demonstrated R sided neglect. Pt unable to follow two step commands and inconsistently follows one step commands.           Pertinent Vitals/Pain Pain Assessment: No/denies pain           PT Goals (current goals can now be found in the care plan section) Acute Rehab PT Goals PT Goal Formulation: With patient Time For Goal Achievement: 10/31/13 Potential to Achieve Goals: Good Progress towards PT goals: Progressing toward goals    Frequency  Min 3X/week    PT Plan Current plan remains appropriate       End of Session Equipment Utilized During Treatment: Gait belt Activity Tolerance: Patient tolerated treatment well;Patient limited by  fatigue Patient left: with chair alarm set;with call bell/phone within reach;in chair     Time: 1355-1414 PT Time Calculation (min): 19 min  Charges:  $Therapeutic Activity: 8-22 mins                    G Codes:      Mardi [place], [place] [phone]

## 2013-10-30 NOTE — Progress Notes (Signed)
Read, reviewed, edited and agree with student's findings and recommendations.  Salia Cangemi B. Wayne Brunker, PT, DPT #[phone]  

## 2013-10-30 NOTE — Progress Notes (Signed)
Please see consult note from 10/17/13 as well as CM note regarding discussion with family. Patient continues to have fluctuating progress with poor awareness/safety and will require supervision at discharge. Family has elected on SNF and would recommend continuing to pursue this once patient medically ready for discharge.

## 2013-10-30 NOTE — Progress Notes (Signed)
IM( Important Message from Medicare given to the patient) explaining his rights as a patient; B Shelba Flakehandler RN,BSN,MHA

## 2013-11-17 ENCOUNTER — Inpatient Hospital Stay (HOSPITAL_COMMUNITY)
Admission: EM | Admit: 2013-11-17 | Discharge: 2013-11-22 | DRG: 689 | Disposition: A | Discharge status: Skilled Nursing Facility | Payer: Medicare Other | Attending: Internal Medicine | Admitting: Internal Medicine

## 2013-11-17 ENCOUNTER — Emergency Department (HOSPITAL_COMMUNITY): Payer: Medicare Other

## 2013-11-17 DIAGNOSIS — S065X9A Traumatic subdural hemorrhage with loss of consciousness of unspecified duration, initial encounter: Secondary | ICD-10-CM

## 2013-11-17 DIAGNOSIS — Z87820 Personal history of traumatic brain injury: Secondary | ICD-10-CM | POA: Diagnosis not present

## 2013-11-17 DIAGNOSIS — G9341 Metabolic encephalopathy: Secondary | ICD-10-CM | POA: Diagnosis present

## 2013-11-17 DIAGNOSIS — N179 Acute kidney failure, unspecified: Secondary | ICD-10-CM

## 2013-11-17 DIAGNOSIS — E1169 Type 2 diabetes mellitus with other specified complication: Secondary | ICD-10-CM | POA: Diagnosis present

## 2013-11-17 DIAGNOSIS — G9340 Encephalopathy, unspecified: Secondary | ICD-10-CM

## 2013-11-17 DIAGNOSIS — E861 Hypovolemia: Secondary | ICD-10-CM | POA: Diagnosis present

## 2013-11-17 DIAGNOSIS — S065XAA Traumatic subdural hemorrhage with loss of consciousness status unknown, initial encounter: Secondary | ICD-10-CM

## 2013-11-17 DIAGNOSIS — E860 Dehydration: Secondary | ICD-10-CM

## 2013-11-17 DIAGNOSIS — E870 Hyperosmolality and hypernatremia: Secondary | ICD-10-CM | POA: Diagnosis present

## 2013-11-17 DIAGNOSIS — N390 Urinary tract infection, site not specified: Principal | ICD-10-CM | POA: Diagnosis present

## 2013-11-17 DIAGNOSIS — F17200 Nicotine dependence, unspecified, uncomplicated: Secondary | ICD-10-CM | POA: Diagnosis present

## 2013-11-17 DIAGNOSIS — R4182 Altered mental status, unspecified: Secondary | ICD-10-CM

## 2013-11-17 LAB — URINALYSIS, ROUTINE W REFLEX MICROSCOPIC
BILIRUBIN URINE: NEGATIVE
Glucose, UA: NEGATIVE mg/dL
Ketones, ur: NEGATIVE mg/dL
Nitrite: POSITIVE — AB
PROTEIN: 30 mg/dL — AB
Specific Gravity, Urine: 1.024 (ref 1.005–1.030)
UROBILINOGEN UA: 0.2 mg/dL (ref 0.0–1.0)
pH: 5.5 (ref 5.0–8.0)

## 2013-11-17 LAB — RAPID URINE DRUG SCREEN, HOSP PERFORMED
AMPHETAMINES: NOT DETECTED
Barbiturates: NOT DETECTED
Benzodiazepines: NOT DETECTED
COCAINE: NOT DETECTED
OPIATES: NOT DETECTED
TETRAHYDROCANNABINOL: NOT DETECTED

## 2013-11-17 LAB — URINE MICROSCOPIC-ADD ON

## 2013-11-17 LAB — CBG MONITORING, ED
GLUCOSE-CAPILLARY: 49 mg/dL — AB (ref 70–99)
Glucose-Capillary: 102 mg/dL — ABNORMAL HIGH (ref 70–99)
Glucose-Capillary: 113 mg/dL — ABNORMAL HIGH (ref 70–99)
Glucose-Capillary: 90 mg/dL (ref 70–99)

## 2013-11-17 LAB — COMPREHENSIVE METABOLIC PANEL
ALK PHOS: 99 U/L (ref 39–117)
ALT: 16 U/L (ref 0–53)
AST: 22 U/L (ref 0–37)
Albumin: 3.2 g/dL — ABNORMAL LOW (ref 3.5–5.2)
Anion gap: 19 — ABNORMAL HIGH (ref 5–15)
BUN: 56 mg/dL — ABNORMAL HIGH (ref 6–23)
CO2: 20 mEq/L (ref 19–32)
Calcium: 11.1 mg/dL — ABNORMAL HIGH (ref 8.4–10.5)
Chloride: 122 mEq/L — ABNORMAL HIGH (ref 96–112)
Creatinine, Ser: 1.25 mg/dL (ref 0.50–1.35)
GFR calc non Af Amer: 55 mL/min — ABNORMAL LOW (ref >90)
GFR, EST AFRICAN AMERICAN: 64 mL/min — AB (ref >90)
GLUCOSE: 67 mg/dL — AB (ref 70–99)
POTASSIUM: 3.8 meq/L (ref 3.7–5.3)
SODIUM: 161 meq/L — AB (ref 137–147)
TOTAL PROTEIN: 9.3 g/dL — AB (ref 6.0–8.3)
Total Bilirubin: 0.7 mg/dL (ref 0.3–1.2)

## 2013-11-17 LAB — DIFFERENTIAL
Basophils Absolute: 0 10*3/uL (ref 0.0–0.1)
Basophils Relative: 0 % (ref 0–1)
Eosinophils Absolute: 0.1 10*3/uL (ref 0.0–0.7)
Eosinophils Relative: 1 % (ref 0–5)
LYMPHS ABS: 2.1 10*3/uL (ref 0.7–4.0)
LYMPHS PCT: 15 % (ref 12–46)
Monocytes Absolute: 0.7 10*3/uL (ref 0.1–1.0)
Monocytes Relative: 4 % (ref 3–12)
NEUTROS PCT: 80 % — AB (ref 43–77)
Neutro Abs: 11.7 10*3/uL — ABNORMAL HIGH (ref 1.7–7.7)

## 2013-11-17 LAB — I-STAT TROPONIN, ED: Troponin i, poc: 0 ng/mL (ref 0.00–0.08)

## 2013-11-17 LAB — CBC
HCT: 49.6 % (ref 39.0–52.0)
Hemoglobin: 15.6 g/dL (ref 13.0–17.0)
MCH: 27 pg (ref 26.0–34.0)
MCHC: 31.5 g/dL (ref 30.0–36.0)
MCV: 86 fL (ref 78.0–100.0)
PLATELETS: 198 10*3/uL (ref 150–400)
RBC: 5.77 MIL/uL (ref 4.22–5.81)
RDW: 14.5 % (ref 11.5–15.5)
WBC: 14.7 10*3/uL — AB (ref 4.0–10.5)

## 2013-11-17 LAB — APTT: aPTT: 29 seconds (ref 24–37)

## 2013-11-17 LAB — PROTIME-INR
INR: 1.21 (ref 0.00–1.49)
PROTHROMBIN TIME: 15.3 s — AB (ref 11.6–15.2)

## 2013-11-17 LAB — ETHANOL: Alcohol, Ethyl (B): <11 mg/dL (ref 0–11)

## 2013-11-17 MED ORDER — DEXTROSE 50 % IV SOLN
25.0000 mL | Freq: Once | INTRAVENOUS | Status: AC
  Administered 2013-11-17: 25 mL via INTRAVENOUS

## 2013-11-17 MED ORDER — DEXTROSE 50 % IV SOLN
INTRAVENOUS | Status: AC
  Administered 2013-11-17: 25 mL via INTRAVENOUS
  Filled 2013-11-17: qty 50

## 2013-11-17 MED ORDER — DEXTROSE-NACL 5-0.9 % IV SOLN
INTRAVENOUS | Status: DC
  Administered 2013-11-18: 02:00:00 via INTRAVENOUS

## 2013-11-17 NOTE — ED Notes (Signed)
Patient CBG 49, 1/2 D50 administered. EDP made aware.
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
## 2013-11-17 NOTE — ED Notes (Signed)
pjatient arrived via [facility] from NH with altered mental status. NH unable to say what patients baseline is. Na+ 165 tonight, EMS CBG was 52 and a 1/2 amp of D50 administered with a repeat of 174. Patient is lethargic at this time with right sided weakness and right eye droop with an unknown time of last seen normal.

## 2013-11-17 NOTE — ED Notes (Signed)
Patient arrived lethargic, responds to stimulation and voice. He has right sided weakness and a slight left facial droop. Nursing home unable to give a last seen normal time or what the patients baseline is. He has slurred speech.

## 2013-11-17 NOTE — ED Provider Notes (Signed)
CSN: [id number]     Arrival date & time 11/17/13  1953 History   First MD Initiated Contact with Patient 11/17/13 2027     Chief Complaint  Patient presents with  . Altered Mental Status     (Consider location/radiation/quality/duration/timing/severity/associated sxs/prior Treatment) HPI Comments: Arthur Bailey 73 y.o. Presenting from nursing home with AMS of unknown duration. EMS states Na 165 and BG of 52 prior to arrival.  1/2 amp of D50 was given and repeat BG of 174 with no improvement in mental status.  POA at bedside states pt was alert and talkative yesterday at his neurologist appt and last time seen normal is unknown.  Pt functional status includes use of a wheelchair since his craniotomy in July of 2015.  POA states pt normally "talks out of his head" but is able to converse at his baseline.     Patient is a 73 y.o. male presenting with altered mental status. The history is provided by the EMS personnel. The history is limited by the condition of the patient. No language interpreter was used.  Altered Mental Status Presenting symptoms: confusion and unresponsiveness   Severity:  Moderate Most recent episode:  Today Episode history:  Continuous Duration: unknown. Timing:  Constant Progression:  Unable to specify Chronicity:  New Context: head injury   Recent head injury:  Over 24 hours ago   Past Medical History  Diagnosis Date  . Diabetes mellitus   . Hypertension   . Neuropathy    Past Surgical History  Procedure Laterality Date  . Craniotomy Left 10/15/2013    Procedure: CRANIOTOMY HEMATOMA EVACUATION SUBDURAL;  Surgeon: Lisbeth Renshaw, MD;  Location: [facility];  Service: Neurosurgery;  Laterality: Left;  . Craniotomy Left 10/23/2013    Procedure: LEFT CRANIOTOMY FOR HYGROMA EVACUATION;  Surgeon: Lisbeth Renshaw, MD;  Location: [facility];  Service: Neurosurgery;  Laterality: Left;   No family history on file. History  Substance Use Topics  . Smoking status:  Current Every Day Smoker -- 0.50 packs/day    Types: Cigarettes  . Smokeless tobacco: Not on file  . Alcohol Use: 6.0 oz/week    3 Cans of beer, 7 Shots of liquor per week     Comment: 3/week, "1 rum and coke a day"    Review of Systems  Unable to perform ROS: Acuity of condition  Psychiatric/Behavioral: Positive for confusion.      Allergies  Garlic  Home Medications   Prior to Admission medications   Medication Sig Start Date End Date Taking? Authorizing Provider  amoxicillin-clavulanate (AUGMENTIN) 875-125 MG per tablet Take 1 tablet by mouth every 12 (twelve) hours. 09/20/13   Toy Baker, MD  insulin glargine (LANTUS) 100 UNIT/ML injection Inject 44 Units into the skin 2 (two) times daily. Only takes if CBG > 185    Historical Provider, MD  insulin regular (NOVOLIN R,HUMULIN R) 100 units/mL injection Inject 4 Units into the skin as needed for high blood sugar. Only takes if CBG > 300.    Historical Provider, MD  levETIRAcetam (KEPPRA) 500 MG tablet Take 1 tablet (500 mg total) by mouth 2 (two) times daily. 10/30/13   Lisbeth Renshaw, MD  loratadine (CLARITIN) 10 MG tablet Take 1 tablet (10 mg total) by mouth daily. 09/20/13   Toy Baker, MD  metFORMIN (GLUCOPHAGE) 1000 MG tablet Take 1,000 mg by mouth 2 (two) times daily with a meal.    Historical Provider, MD  ondansetron (ZOFRAN ODT) 4 MG disintegrating tablet  Take 1 tablet (4 mg total) by mouth every 6 (six) hours as needed for nausea or vomiting. 09/06/13   Audree Camel, MD  oxyCODONE-acetaminophen (PERCOCET) 5-325 MG per tablet Take 1-2 tablets by mouth every 4 (four) hours as needed. 09/06/13   Audree Camel, MD   BP 119/63  Pulse 100  Temp(Src) 98.2 F (36.8 C) (Oral)  Resp 21  SpO2 96% Physical Exam  Nursing note and vitals reviewed. Constitutional: He appears well-developed and well-nourished. No distress.  HENT:  Head: Normocephalic and atraumatic.  Eyes: Conjunctivae and EOM are normal. Right eye  exhibits no discharge. Left eye exhibits no discharge.  Neck: Normal range of motion. Neck supple. No tracheal deviation present.  Cardiovascular: Normal rate, regular rhythm and normal heart sounds.  Exam reveals no friction rub.   No murmur heard. Pulmonary/Chest: Effort normal and breath sounds normal. No stridor. No respiratory distress. He has no wheezes. He has no rales. He exhibits no tenderness.  Abdominal: Soft. He exhibits no distension. There is no tenderness. There is no rebound and no guarding.  Neurological: He is alert.  Oriented to self  Skin: Skin is warm.  Psychiatric: He has a normal mood and affect.    ED Course  Procedures (including critical care time) Labs Review Labs Reviewed  PROTIME-INR - Abnormal; Notable for the following:    Prothrombin Time 15.3 (*)    All other components within normal limits  CBC - Abnormal; Notable for the following:    WBC 14.7 (*)    All other components within normal limits  DIFFERENTIAL - Abnormal; Notable for the following:    Neutrophils Relative % 80 (*)    Neutro Abs 11.7 (*)    All other components within normal limits  CBG MONITORING, ED - Abnormal; Notable for the following:    Glucose-Capillary 49 (*)    All other components within normal limits  CBG MONITORING, ED - Abnormal; Notable for the following:    Glucose-Capillary 113 (*)    All other components within normal limits  APTT  ETHANOL  COMPREHENSIVE METABOLIC PANEL  URINE RAPID DRUG SCREEN (HOSP PERFORMED)  URINALYSIS, ROUTINE W REFLEX MICROSCOPIC  I-STAT TROPOININ, ED  CBG MONITORING, ED    Imaging Review Ct Head Wo Contrast  11/17/2013   CLINICAL DATA:  Altered mental status. Lethargic. Right-sided weakness.  EXAM: CT HEAD WITHOUT CONTRAST  TECHNIQUE: Contiguous axial images were obtained from the base of the skull through the vertex without intravenous contrast.  COMPARISON:  10/26/2013  FINDINGS: Large left subdural hematoma is again noted, stable in  size. Evolutionary changes within the blood products. Thickness 22 mm. Mass effect on the underlying brain parenchyma with minimal midline shift, 2 mm No new hemorrhage. No hydrocephalus. No acute infarction. Decreasing pneumocephalus.  Changes of left temporal craniotomy.  IMPRESSION: Stable residual large left subdural hematoma.  Improving pneumocephalus.   Electronically Signed   By: Charlett Nose M.D.   On: 11/17/2013 20:53     EKG Interpretation   Date/Time:  Friday November 17 2013 19:57:48 EDT Ventricular Rate:  104 PR Interval:  126 QRS Duration: 78 QT Interval:  376 QTC Calculation: 495 R Axis:   81 Text Interpretation:  Sinus tachycardia Ventricular premature complex  Biatrial enlargement Borderline right axis deviation Probable LVH with  secondary repol abnrm Borderline prolonged QT interval Baseline wander in  lead(s) V5 Confirmed by Bebe Shaggy  MD, DONALD ([id number]) on 11/17/2013 8:06:23  PM      MDM  Final diagnoses:  None    Pt is following commands but altered compared to his baseline. AF, slightly tachycardic, but normotensive. BS here 43, gave an amp of D50, repeat 113. AMS work up indicated. CT head NAICA, subdural is improving. Na 161. Sx likely multifactorial including na and hypoglycemia. On a sulfonylurea so will need frequent BS checks for the next 24 hours. CXR neg for PNA. Required another amp of D 50 in the ED for hypoglycemia. Started D5-1/2 NS. UA c/w UTI. Gave IV abx. Will need admission to step down for glucose management and mgt of hypernatremia. Admitted without any events. Care discussed with my attending, Dr. Bebe Shaggy.     Sena Hitch, MD 11/18/13 1610  Sena Hitch, MD 11/18/13 [phone]

## 2013-11-18 DIAGNOSIS — N179 Acute kidney failure, unspecified: Secondary | ICD-10-CM | POA: Diagnosis present

## 2013-11-18 DIAGNOSIS — E870 Hyperosmolality and hypernatremia: Secondary | ICD-10-CM

## 2013-11-18 LAB — GLUCOSE, CAPILLARY
GLUCOSE-CAPILLARY: 207 mg/dL — AB (ref 70–99)
GLUCOSE-CAPILLARY: 171 mg/dL — AB (ref 70–99)
Glucose-Capillary: 58 mg/dL — ABNORMAL LOW (ref 70–99)
Glucose-Capillary: 142 mg/dL — ABNORMAL HIGH (ref 70–99)
Glucose-Capillary: 149 mg/dL — ABNORMAL HIGH (ref 70–99)
Glucose-Capillary: 94 mg/dL (ref 70–99)
Glucose-Capillary: 74 mg/dL (ref 70–99)
Glucose-Capillary: 59 mg/dL — ABNORMAL LOW (ref 70–99)
Glucose-Capillary: 252 mg/dL — ABNORMAL HIGH (ref 70–99)
Glucose-Capillary: 117 mg/dL — ABNORMAL HIGH (ref 70–99)

## 2013-11-18 LAB — BASIC METABOLIC PANEL
ANION GAP: 16 — AB (ref 5–15)
ANION GAP: 12 (ref 5–15)
BUN: 55 mg/dL — ABNORMAL HIGH (ref 6–23)
BUN: 47 mg/dL — ABNORMAL HIGH (ref 6–23)
CALCIUM: 10.3 mg/dL (ref 8.4–10.5)
CO2: 22 mEq/L (ref 19–32)
CO2: 23 mEq/L (ref 19–32)
CREATININE: 1.41 mg/dL — AB (ref 0.50–1.35)
Calcium: 9.2 mg/dL (ref 8.4–10.5)
Chloride: 126 mEq/L — ABNORMAL HIGH (ref 96–112)
Chloride: 127 mEq/L — ABNORMAL HIGH (ref 96–112)
Creatinine, Ser: 1.39 mg/dL — ABNORMAL HIGH (ref 0.50–1.35)
GFR calc Af Amer: 56 mL/min — ABNORMAL LOW (ref >90)
GFR, EST AFRICAN AMERICAN: 56 mL/min — AB (ref >90)
GFR, EST NON AFRICAN AMERICAN: 49 mL/min — AB (ref >90)
GFR, EST NON AFRICAN AMERICAN: 48 mL/min — AB (ref >90)
GLUCOSE: 111 mg/dL — AB (ref 70–99)
Glucose, Bld: 132 mg/dL — ABNORMAL HIGH (ref 70–99)
POTASSIUM: 3.7 meq/L (ref 3.7–5.3)
Potassium: 4 mEq/L (ref 3.7–5.3)
SODIUM: 164 meq/L — AB (ref 137–147)
Sodium: 162 mEq/L — ABNORMAL HIGH (ref 137–147)

## 2013-11-18 LAB — BASIC METABOLIC PANEL WITH GFR
Anion gap: 14 (ref 5–15)
BUN: 51 mg/dL — ABNORMAL HIGH (ref 6–23)
CO2: 21 meq/L (ref 19–32)
Calcium: 9.4 mg/dL (ref 8.4–10.5)
Chloride: 122 meq/L — ABNORMAL HIGH (ref 96–112)
Creatinine, Ser: 1.53 mg/dL — ABNORMAL HIGH (ref 0.50–1.35)
GFR calc Af Amer: 50 mL/min — ABNORMAL LOW (ref >90)
GFR calc non Af Amer: 43 mL/min — ABNORMAL LOW (ref >90)
Glucose, Bld: 284 mg/dL — ABNORMAL HIGH (ref 70–99)
Potassium: 3.6 meq/L — ABNORMAL LOW (ref 3.7–5.3)
Sodium: 157 meq/L — ABNORMAL HIGH (ref 137–147)

## 2013-11-18 LAB — MRSA PCR SCREENING: MRSA by PCR: NEGATIVE

## 2013-11-18 LAB — CBC
HEMATOCRIT: 40.8 % (ref 39.0–52.0)
HEMOGLOBIN: 13 g/dL (ref 13.0–17.0)
MCH: 26.3 pg (ref 26.0–34.0)
MCHC: 31.9 g/dL (ref 30.0–36.0)
MCV: 82.4 fL (ref 78.0–100.0)
Platelets: 223 10*3/uL (ref 150–400)
RBC: 4.95 MIL/uL (ref 4.22–5.81)
RDW: 14.5 % (ref 11.5–15.5)
WBC: 12.9 10*3/uL — AB (ref 4.0–10.5)

## 2013-11-18 MED ORDER — SODIUM CHLORIDE 0.45 % IV SOLN: INTRAVENOUS | Status: DC

## 2013-11-18 MED ORDER — DEXTROSE 50 % IV SOLN
25.0000 mL | Freq: Once | INTRAVENOUS | Status: AC | PRN
  Administered 2013-11-18: 50 mL via INTRAVENOUS

## 2013-11-18 MED ORDER — ACETAMINOPHEN 650 MG RE SUPP: 650.0000 mg | Freq: Four times a day (QID) | RECTAL | Status: DC | PRN

## 2013-11-18 MED ORDER — ONDANSETRON HCL 4 MG/2ML IJ SOLN: 4.0000 mg | Freq: Four times a day (QID) | INTRAMUSCULAR | Status: DC | PRN

## 2013-11-18 MED ORDER — DEXTROSE 50 % IV SOLN
50.0000 mL | Freq: Once | INTRAVENOUS | Status: AC | PRN
  Administered 2013-11-18: 50 mL via INTRAVENOUS

## 2013-11-18 MED ORDER — DEXTROSE 50 % IV SOLN
INTRAVENOUS | Status: AC
  Administered 2013-11-18: 50 mL via INTRAVENOUS
  Filled 2013-11-18: qty 50

## 2013-11-18 MED ORDER — SODIUM CHLORIDE 0.9 % IV SOLN: INTRAVENOUS | Status: DC

## 2013-11-18 MED ORDER — SODIUM CHLORIDE 0.45 % IV SOLN
INTRAVENOUS | Status: DC
  Administered 2013-11-18: 08:00:00 via INTRAVENOUS

## 2013-11-18 MED ORDER — LEVETIRACETAM IN NACL 500 MG/100ML IV SOLN
500.0000 mg | Freq: Two times a day (BID) | INTRAVENOUS | Status: DC
  Administered 2013-11-18 – 2013-11-22 (×9): 500 mg via INTRAVENOUS
  Filled 2013-11-18 (×11): qty 100

## 2013-11-18 MED ORDER — ONDANSETRON HCL 4 MG PO TABS: 4.0000 mg | ORAL_TABLET | Freq: Four times a day (QID) | ORAL | Status: DC | PRN

## 2013-11-18 MED ORDER — DEXTROSE 5 % IV SOLN
INTRAVENOUS | Status: DC
  Administered 2013-11-18: 06:00:00 via INTRAVENOUS

## 2013-11-18 MED ORDER — FOLIC ACID 5 MG/ML IJ SOLN
1.0000 mg | Freq: Every day | INTRAMUSCULAR | Status: DC
  Administered 2013-11-18 – 2013-11-22 (×5): 1 mg via INTRAVENOUS
  Filled 2013-11-18 (×5): qty 0.2

## 2013-11-18 MED ORDER — SODIUM CHLORIDE 0.9 % IJ SOLN
3.0000 mL | Freq: Two times a day (BID) | INTRAMUSCULAR | Status: DC
  Administered 2013-11-18 – 2013-11-20 (×3): 3 mL via INTRAVENOUS

## 2013-11-18 MED ORDER — ACETAMINOPHEN 325 MG PO TABS: 650.0000 mg | ORAL_TABLET | Freq: Four times a day (QID) | ORAL | Status: DC | PRN

## 2013-11-18 MED ORDER — SODIUM CHLORIDE 0.9 % IV BOLUS (SEPSIS)
1000.0000 mL | Freq: Once | INTRAVENOUS | Status: AC
  Administered 2013-11-18: 1000 mL via INTRAVENOUS

## 2013-11-18 MED ORDER — THIAMINE HCL 100 MG/ML IJ SOLN
100.0000 mg | Freq: Every day | INTRAMUSCULAR | Status: DC
  Administered 2013-11-18 – 2013-11-22 (×5): 100 mg via INTRAVENOUS
  Filled 2013-11-18 (×5): qty 1

## 2013-11-18 MED ORDER — DEXTROSE 50 % IV SOLN
25.0000 mL | Freq: Once | INTRAVENOUS | Status: DC | PRN
  Filled 2013-11-18: qty 50

## 2013-11-18 MED ORDER — DEXTROSE-NACL 5-0.45 % IV SOLN
INTRAVENOUS | Status: DC
  Administered 2013-11-18: 10:00:00 via INTRAVENOUS

## 2013-11-18 MED ORDER — LEVETIRACETAM 500 MG PO TABS
500.0000 mg | ORAL_TABLET | Freq: Two times a day (BID) | ORAL | Status: DC
  Filled 2013-11-18 (×3): qty 1

## 2013-11-18 MED ORDER — HEPARIN SODIUM (PORCINE) 5000 UNIT/ML IJ SOLN
5000.0000 [IU] | Freq: Three times a day (TID) | INTRAMUSCULAR | Status: DC
  Administered 2013-11-18 – 2013-11-21 (×10): 5000 [IU] via SUBCUTANEOUS
  Filled 2013-11-18 (×15): qty 1

## 2013-11-18 MED ORDER — PIPERACILLIN-TAZOBACTAM 3.375 G IVPB 30 MIN
3.3750 g | Freq: Once | INTRAVENOUS | Status: AC
  Administered 2013-11-18: 3.375 g via INTRAVENOUS
  Filled 2013-11-18: qty 50
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
## 2013-11-18 NOTE — Progress Notes (Signed)
Nutrition Brief Note  Malnutrition Screening Tool result is inaccurate.  Please consult if nutrition needs are identified.  Kallista Pae MS, RD, LDN [phone] Pager [phone] Weekend/After Hours Pager  

## 2013-11-18 NOTE — Progress Notes (Signed)
Pt admitted from ED to unit. Pt came in with altered mental status and hypernatremia. Pt talking but confused and oriented to self only. HR in 100s. Pt on RA O2 sats high 90's. Condom catheter placed on pt; waiting for pt to void. Will continue to monitor.

## 2013-11-18 NOTE — Progress Notes (Signed)
Pt ordered D5NS continuous infusion. Pt's Na was 6.1 in ED. Admitting MD paged to clarify order. MD never responded. Floor coverage MD called and given instruction to give D5NS anyways. Fluid given and will continue to monitor pt at this time.

## 2013-11-18 NOTE — ED Provider Notes (Signed)
I have personally seen and examined the patient.  I have discussed the plan of care with the resident.  I have reviewed the documentation on PMH/FH/Soc. History.  I have reviewed the documentation of the resident and agree.  PATIENT WITH HYPERNATREMIA/ALTERED MENTAL STATUS AND SIGNIFICANT HYPOGLYCEMIA THAT REQUIRED GLUCOSE CHECKS AND REQUIRED IV DEXTROSE DRIP PT STABILIZED IN THE ED  CRITICAL CARE Performed by: Joya Gaskins Total critical care time: 33 Critical care time was exclusive of separately billable procedures and treating other patients. Critical care was necessary to treat or prevent imminent or life-threatening deterioration. Critical care was time spent personally by me on the following activities: development of treatment plan with patient and/or surrogate as well as nursing, discussions with consultants, evaluation of patient's response to treatment, examination of patient, obtaining history from patient or surrogate, ordering and performing treatments and interventions, ordering and review of laboratory studies, ordering and review of radiographic studies, pulse oximetry and re-evaluation of patient's condition.   Joya Gaskins, MD 11/18/13 [phone]

## 2013-11-18 NOTE — Progress Notes (Addendum)
CRITICAL VALUE ALERT  Critical value received:  Na 164   Date of notification:  11/18/13  Time of notification:  0536  Critical value read back:Yes.    Nurse who received alert:  Bufford Lope, RN  MD notified (1st page):  Dr. Susie Cassette  Time of first page:  [phone]  MD notified (2nd page): Dr. Susie Cassette   Time of second page: 0601  Responding MD:  Dr. Susie Cassette  Time MD responded:  [phone]

## 2013-11-18 NOTE — Progress Notes (Signed)
[facility] PROGRESS NOTE  Arthur Bailey [id number]:[id number] DOB: February 09, 1941 DOA: 11/17/2013 PCP: Default, Provider, MD  Assessment/Plan: 1. Acute encephalopathy -Likely secondary to metabolic encephalopathy in setting of hypernatremia, profound dehydration and hypoglycemia. -He was administered D5W overnight, change to D5 half-normal saline this morning currently running at 100 mL/hour -Patient showed improvement as he was able to identify this place as [facility]. -CT scan of head showed stable residual large left subdural hematoma -Continue supportive care and close monitoring in the step down unit -He was seen by speech/path, recommended continuing NPO  2.  Hypernatremia -Lab work showed a sodium of 164, and had been out of 34 on 10/28/2013 -I suspect secondary to profound dehydration -Continue half normal saline running at 100 mL/hour  3.  Acute kidney injury -Secondary to prerenal azotemia/hypovolemia -Lab work revealing an elevated creatinine of 1.39 with BUN of 55 -Continue IV fluids  4. History of subdural hematoma -Repeat CT scan showing stable residual large left subdural hematoma  Code Status: Full Code Family Communication: Family not present Disposition Plan: Continue close monitoring in SDU, IV fluids, NPO, will likely be discharged to SNF when medically stable   Consultants:  Speech/Path  PT   HPI/Subjective: Patient is a pleasant 73 year old gentleman with a past medical history of subdural hematoma recently discharged from the medicine service on 10/30/2013 to skilled nursing facility, presented as a transfer overnight to the emergency department for encephalopathy. Lab work showing a sodium of 165 with blood sugar of 52  Objective: Filed Vitals:   11/18/13 1200  BP:   Pulse:   Temp: 97.4 F (36.3 C)  Resp:     Intake/Output Summary (Last 24 hours) at 11/18/13 1329 Last data filed at 11/18/13 5784  Gross per 24 hour  Intake  620.84 ml  Output      0 ml  Net 620.84 ml   Filed Weights   11/18/13 0100  Weight: 45.7 kg (100 lb 12 oz)    Exam:   General:  Patient is confused, however was able to identify this place as [facility].   Cardiovascular: Regular rate and rhythm, normal S1S2  Respiratory: Normal inspiratory effort, lungs clear to ausculation bilaterally  Abdomen: Soft, nontender, nondistended  Musculoskeletal: No edema  Data Reviewed: Basic Metabolic Panel:  Recent Labs Lab 11/17/13 2128 11/18/13 0415  NA 161* 164*  K 3.8 3.7  CL 122* 126*  CO2 20 22  GLUCOSE 67* 111*  BUN 56* 55*  CREATININE 1.25 1.39*  CALCIUM 11.1* 10.3   Liver Function Tests:  Recent Labs Lab 11/17/13 2128  AST 22  ALT 16  ALKPHOS 99  BILITOT 0.7  PROT 9.3*  ALBUMIN 3.2*   No results found for this basename: LIPASE, AMYLASE,  in the last 168 hours No results found for this basename: AMMONIA,  in the last 168 hours CBC:  Recent Labs Lab 11/17/13 2128 11/18/13 0415  WBC 14.7* 12.9*  NEUTROABS 11.7*  --   HGB 15.6 13.0  HCT 49.6 40.8  MCV 86.0 82.4  PLT 198 223   Cardiac Enzymes: No results found for this basename: CKTOTAL, CKMB, CKMBINDEX, TROPONINI,  in the last 168 hours BNP (last 3 results) No results found for this basename: PROBNP,  in the last 8760 hours CBG:  Recent Labs Lab 11/18/13 0147 11/18/13 0223 11/18/13 0418 11/18/13 0731 11/18/13 1141  GLUCAP 149* 142* 94 74 59*    Recent Results (from the past 240 hour(s))  MRSA PCR  SCREENING     Status: None   Collection Time    11/18/13  1:04 AM      Result Value Ref Range Status   MRSA by PCR NEGATIVE  NEGATIVE Final   Comment:            The GeneXpert MRSA Assay (FDA     approved for NASAL specimens     only), is one component of a     comprehensive MRSA colonization     surveillance program. It is not     intended to diagnose MRSA     infection nor to guide or     monitor treatment for     MRSA infections.      Studies: Ct Head Wo Contrast  11/17/2013   CLINICAL DATA:  Altered mental status. Lethargic. Right-sided weakness.  EXAM: CT HEAD WITHOUT CONTRAST  TECHNIQUE: Contiguous axial images were obtained from the base of the skull through the vertex without intravenous contrast.  COMPARISON:  10/26/2013  FINDINGS: Large left subdural hematoma is again noted, stable in size. Evolutionary changes within the blood products. Thickness 22 mm. Mass effect on the underlying brain parenchyma with minimal midline shift, 2 mm No new hemorrhage. No hydrocephalus. No acute infarction. Decreasing pneumocephalus.  Changes of left temporal craniotomy.  IMPRESSION: Stable residual large left subdural hematoma.  Improving pneumocephalus.   Electronically Signed   By: Charlett Nose M.D.   On: 11/17/2013 20:53   Dg Chest Portable 1 View  11/17/2013   CLINICAL DATA:  Altered mental status.  EXAM: PORTABLE CHEST - 1 VIEW  COMPARISON:  Two-view chest x-ray 09/20/2013.  FINDINGS: The heart size is normal. Biapical densities are new. The lungs are otherwise clear. The visualized soft tissues and bony thorax are unremarkable.  IMPRESSION: 1. New biapical densities. Given the timeframe, neoplasm is considered less likely. Recommend PA and lateral chest radiograph for further evaluation as the patient's condition allows.   Electronically Signed   By: Gennette Pac M.D.   On: 11/17/2013 22:24    Scheduled Meds: . folic acid  1 mg Intravenous Daily  . heparin  5,000 Units Subcutaneous 3 times per day  . levETIRAcetam  500 mg Intravenous Q12H  . sodium chloride  3 mL Intravenous Q12H  . thiamine  100 mg Intravenous Daily   Continuous Infusions: . dextrose 5 % and 0.45% NaCl 100 mL/hr at 11/18/13 1000    Active Problems:   Acute hypernatremia   Altered mental state   Hypernatremia   ARF (acute renal failure)    Time spent:     Jeralyn Bennett  [facility] Pager [phone]. If 7PM-7AM, please contact  night-coverage at [email or web address], password [facility] 11/18/2013, 1:29 PM  LOS: 1 day

## 2013-11-19 DIAGNOSIS — N179 Acute kidney failure, unspecified: Secondary | ICD-10-CM

## 2013-11-19 DIAGNOSIS — I6200 Nontraumatic subdural hemorrhage, unspecified: Secondary | ICD-10-CM

## 2013-11-19 DIAGNOSIS — G9340 Encephalopathy, unspecified: Secondary | ICD-10-CM

## 2013-11-19 DIAGNOSIS — E860 Dehydration: Secondary | ICD-10-CM

## 2013-11-19 LAB — GLUCOSE, CAPILLARY
Glucose-Capillary: 92 mg/dL (ref 70–99)
Glucose-Capillary: 99 mg/dL (ref 70–99)
Glucose-Capillary: 115 mg/dL — ABNORMAL HIGH (ref 70–99)
Glucose-Capillary: 118 mg/dL — ABNORMAL HIGH (ref 70–99)

## 2013-11-19 LAB — BASIC METABOLIC PANEL
Anion gap: 12 (ref 5–15)
Anion gap: 16 — ABNORMAL HIGH (ref 5–15)
BUN: 42 mg/dL — ABNORMAL HIGH (ref 6–23)
BUN: 40 mg/dL — ABNORMAL HIGH (ref 6–23)
CO2: 24 mEq/L (ref 19–32)
CO2: 19 meq/L (ref 19–32)
CREATININE: 1.28 mg/dL (ref 0.50–1.35)
Calcium: 9.9 mg/dL (ref 8.4–10.5)
Calcium: 9.1 mg/dL (ref 8.4–10.5)
Chloride: 125 mEq/L — ABNORMAL HIGH (ref 96–112)
Chloride: 129 mEq/L — ABNORMAL HIGH (ref 96–112)
Creatinine, Ser: 1.12 mg/dL (ref 0.50–1.35)
GFR calc Af Amer: 73 mL/min — ABNORMAL LOW (ref >90)
GFR calc non Af Amer: 63 mL/min — ABNORMAL LOW (ref >90)
GFR, EST AFRICAN AMERICAN: 62 mL/min — AB (ref >90)
GFR, EST NON AFRICAN AMERICAN: 54 mL/min — AB (ref >90)
GLUCOSE: 101 mg/dL — AB (ref 70–99)
Glucose, Bld: 65 mg/dL — ABNORMAL LOW (ref 70–99)
POTASSIUM: 4.6 meq/L (ref 3.7–5.3)
Potassium: 3.6 mEq/L — ABNORMAL LOW (ref 3.7–5.3)
SODIUM: 164 meq/L — AB (ref 137–147)
Sodium: 161 mEq/L — ABNORMAL HIGH (ref 137–147)

## 2013-11-19 LAB — CBC
HCT: 41.1 % (ref 39.0–52.0)
HEMOGLOBIN: 12.6 g/dL — AB (ref 13.0–17.0)
MCH: 26.3 pg (ref 26.0–34.0)
MCHC: 30.7 g/dL (ref 30.0–36.0)
MCV: 85.6 fL (ref 78.0–100.0)
PLATELETS: 189 10*3/uL (ref 150–400)
RBC: 4.8 MIL/uL (ref 4.22–5.81)
RDW: 14.7 % (ref 11.5–15.5)
WBC: 9.8 10*3/uL (ref 4.0–10.5)

## 2013-11-19 LAB — SODIUM, URINE, RANDOM: SODIUM UR: 56 meq/L

## 2013-11-19 MED ORDER — DEXTROSE 5 % IV SOLN
1.0000 g | INTRAVENOUS | Status: DC
  Administered 2013-11-19 – 2013-11-21 (×3): 1 g via INTRAVENOUS
  Filled 2013-11-19 (×4): qty 10

## 2013-11-19 MED ORDER — SODIUM CHLORIDE 0.45 % IV SOLN
INTRAVENOUS | Status: DC
  Administered 2013-11-19 – 2013-11-20 (×2): via INTRAVENOUS

## 2013-11-19 MED ORDER — SODIUM CHLORIDE 0.9 % IV SOLN: INTRAVENOUS | Status: DC

## 2013-11-19 NOTE — Progress Notes (Signed)
CRITICAL VALUE ALERT  Critical value received: Na 164  Date of notification: 11/19/13  Time of notification:  1509  Critical value read back:yes  Nurse who received alert:  Newell Coral RN  MD notified (1st page):  Vanessa Barbara  Time of first page:  1510  MD notified (2nd page):  Time of second page:  Responding MD: Vanessa Barbara  Time MD responded:  [phone]

## 2013-11-19 NOTE — Progress Notes (Signed)
MD called back and is aware of Na level. No orders given at this time. Will continue to monitor.

## 2013-11-19 NOTE — Progress Notes (Signed)
Report to Vernona Rieger RN 3W; pt transferred to tele via bed with no voiced complaints;

## 2013-11-19 NOTE — Progress Notes (Signed)
Second page sent to report Na level. Will await orders.

## 2013-11-19 NOTE — Progress Notes (Signed)
[facility] PROGRESS NOTE  Arthur Bailey ZOX:[id number] DOB: 08/19/1940 DOA: 11/17/2013 PCP: Default, Provider, MD  Assessment/Plan: 1. Acute encephalopathy -Likely secondary to metabolic encephalopathy in setting of hypernatremia, Infectious process, profound dehydration and hypoglycemia. -He was initially administered D5W, now on NS as I suspect profound dehydration causing hypernatremia. He would benefit from volume expansion, particularly if he continues to be NPO. -Patient showing improvement today -CT scan of head showed stable residual large left subdural hematoma -Continue supportive care and close monitoring in the step down unit -He was seen by speech/path, recommended continuing NPO  2.  Urinary Tract Infection -U/A showing many bacteria with presence of nitrates. -Starting Rocephin 1 gram IV q daily, followup on urine cultures -I suspect underlying infectious process contributing to patient's acute encephalopathy  3.  Hypernatremia -I suspect having a hypovolemic origin, appearing dehydrated on physical exam -Will check a Urine Na and Urine Osm as diabetes insipidus is also a possibility. In such case would expect urine osm to be in low/intermediate range.  -Patient is currently NPO and appears clinically hypovolemic. Would continue maintenance NS running at 82mL/hour for now.   4.  Acute kidney injury -Secondary to prerenal azotemia/hypovolemia -Creatinine trending down from 1.53 to 1.28 on this morning's labs after receiving bolus of NS yesterday afternoon -Continue volume expanders for now.   5. History of subdural hematoma -Repeat CT scan showing stable residual large left subdural hematoma  Code Status: Full Code Family Communication: Family not present Disposition Plan: Transfer to telemetry   Consultants:  Speech/Path  PT   HPI/Subjective: Patient is a pleasant 73 year old gentleman with a past medical history of subdural hematoma recently  discharged from the medicine service on 10/30/2013 to skilled nursing facility, presented as a transfer overnight to the emergency department for encephalopathy. Lab work showing a sodium of 165 with blood sugar of 52  Objective: Filed Vitals:   11/19/13 0900  BP: 109/54  Pulse: 100  Temp:   Resp: 19    Intake/Output Summary (Last 24 hours) at 11/19/13 1108 Last data filed at 11/19/13 1000  Gross per 24 hour  Intake   1432 ml  Output    732 ml  Net    700 ml   Filed Weights   11/18/13 0100  Weight: 45.7 kg (100 lb 12 oz)    Exam:   General:  Seems a little better, was assited out of bed to chair  Cardiovascular: Regular rate and rhythm, normal S1S2  Respiratory: Normal inspiratory effort, lungs clear to ausculation bilaterally  Abdomen: Soft, nontender, nondistended  Musculoskeletal: No edema  Data Reviewed: Basic Metabolic Panel:  Recent Labs Lab 11/17/13 2128 11/18/13 0415 11/18/13 1335 11/18/13 2015 11/19/13 0237  NA 161* 164* 157* 162* 161*  K 3.8 3.7 3.6* 4.0 3.6*  CL 122* 126* 122* 127* 125*  CO2 GLUCOSE 67* 111* 284* 132* 65*  BUN 56* 55* 51* 47* 42*  CREATININE 1.25 1.39* 1.53* 1.41* 1.28  CALCIUM 11.1* 10.3 9.4 9.2 9.9   Liver Function Tests:  Recent Labs Lab 11/17/13 2128  AST 22  ALT 16  ALKPHOS 99  BILITOT 0.7  PROT 9.3*  ALBUMIN 3.2*   No results found for this basename: LIPASE, AMYLASE,  in the last 168 hours No results found for this basename: AMMONIA,  in the last 168 hours CBC:  Recent Labs Lab 11/17/13 2128 11/18/13 0415 11/19/13 0237  WBC 14.7* 12.9* 9.8  NEUTROABS 11.7*  --   --  HGB 15.6 13.0 12.6*  HCT 49.6 40.8 41.1  MCV 86.0 82.4 85.6  PLT 198 223 189   Cardiac Enzymes: No results found for this basename: CKTOTAL, CKMB, CKMBINDEX, TROPONINI,  in the last 168 hours BNP (last 3 results) No results found for this basename: PROBNP,  in the last 8760 hours CBG:  Recent Labs Lab 11/18/13 1332  11/18/13 1627 11/18/13 1836 11/18/13 2014 11/19/13 0834  GLUCAP 207* 252* 171* 117* 92    Recent Results (from the past 240 hour(s))  MRSA PCR SCREENING     Status: None   Collection Time    11/18/13  1:04 AM      Result Value Ref Range Status   MRSA by PCR NEGATIVE  NEGATIVE Final   Comment:            The GeneXpert MRSA Assay (FDA     approved for NASAL specimens     only), is one component of a     comprehensive MRSA colonization     surveillance program. It is not     intended to diagnose MRSA     infection nor to guide or     monitor treatment for     MRSA infections.     Studies: Ct Head Wo Contrast  11/17/2013   CLINICAL DATA:  Altered mental status. Lethargic. Right-sided weakness.  EXAM: CT HEAD WITHOUT CONTRAST  TECHNIQUE: Contiguous axial images were obtained from the base of the skull through the vertex without intravenous contrast.  COMPARISON:  10/26/2013  FINDINGS: Large left subdural hematoma is again noted, stable in size. Evolutionary changes within the blood products. Thickness 22 mm. Mass effect on the underlying brain parenchyma with minimal midline shift, 2 mm No new hemorrhage. No hydrocephalus. No acute infarction. Decreasing pneumocephalus.  Changes of left temporal craniotomy.  IMPRESSION: Stable residual large left subdural hematoma.  Improving pneumocephalus.   Electronically Signed   By: Charlett Nose M.D.   On: 11/17/2013 20:53   Dg Chest Portable 1 View  11/17/2013   CLINICAL DATA:  Altered mental status.  EXAM: PORTABLE CHEST - 1 VIEW  COMPARISON:  Two-view chest x-ray 09/20/2013.  FINDINGS: The heart size is normal. Biapical densities are new. The lungs are otherwise clear. The visualized soft tissues and bony thorax are unremarkable.  IMPRESSION: 1. New biapical densities. Given the timeframe, neoplasm is considered less likely. Recommend PA and lateral chest radiograph for further evaluation as the patient's condition allows.   Electronically Signed    By: Gennette Pac M.D.   On: 11/17/2013 22:24    Scheduled Meds: . folic acid  1 mg Intravenous Daily  . heparin  5,000 Units Subcutaneous 3 times per day  . levETIRAcetam  500 mg Intravenous Q12H  . sodium chloride  3 mL Intravenous Q12H  . thiamine  100 mg Intravenous Daily   Continuous Infusions: . sodium chloride Stopped (11/19/13 0042)  . sodium chloride 75 mL/hr at 11/19/13 0800    Active Problems:   Acute hypernatremia   Altered mental state   Hypernatremia   ARF (acute renal failure)    Time spent: 40 min    Jeralyn Bennett  [facility] Pager [phone]. If 7PM-7AM, please contact night-coverage at [email or web address], password [facility] 11/19/2013, 11:08 AM  LOS: 2 days

## 2013-11-19 NOTE — Progress Notes (Addendum)
Paged MD on call about Na level. Waiting for orders.

## 2013-11-20 LAB — FOLLICLE STIMULATING HORMONE: FSH: 3.5 m[IU]/mL (ref 1.4–18.1)

## 2013-11-20 LAB — CBC
HEMATOCRIT: 36.4 % — AB (ref 39.0–52.0)
Hemoglobin: 11.2 g/dL — ABNORMAL LOW (ref 13.0–17.0)
MCH: 26.6 pg (ref 26.0–34.0)
MCHC: 30.8 g/dL (ref 30.0–36.0)
MCV: 86.5 fL (ref 78.0–100.0)
Platelets: 169 10*3/uL (ref 150–400)
RBC: 4.21 MIL/uL — AB (ref 4.22–5.81)
RDW: 15 % (ref 11.5–15.5)
WBC: 9.5 10*3/uL (ref 4.0–10.5)

## 2013-11-20 LAB — GLUCOSE, CAPILLARY
GLUCOSE-CAPILLARY: 144 mg/dL — AB (ref 70–99)
GLUCOSE-CAPILLARY: 192 mg/dL — AB (ref 70–99)
Glucose-Capillary: 179 mg/dL — ABNORMAL HIGH (ref 70–99)

## 2013-11-20 LAB — LUTEINIZING HORMONE: LH: 1.6 m[IU]/mL — ABNORMAL LOW (ref 3.1–34.6)

## 2013-11-20 LAB — OSMOLALITY, URINE
OSMOLALITY UR: 614 mosm/kg (ref 390–1090)
OSMOLALITY UR: 723 mosm/kg (ref 390–1090)

## 2013-11-20 LAB — BASIC METABOLIC PANEL
ANION GAP: 13 (ref 5–15)
Anion gap: 13 (ref 5–15)
BUN: 37 mg/dL — AB (ref 6–23)
BUN: 33 mg/dL — ABNORMAL HIGH (ref 6–23)
CHLORIDE: 129 meq/L — AB (ref 96–112)
CHLORIDE: 124 meq/L — AB (ref 96–112)
CO2: 22 mEq/L (ref 19–32)
CO2: 23 mEq/L (ref 19–32)
CREATININE: 1.09 mg/dL (ref 0.50–1.35)
Calcium: 8.8 mg/dL (ref 8.4–10.5)
Calcium: 8.8 mg/dL (ref 8.4–10.5)
Creatinine, Ser: 1 mg/dL (ref 0.50–1.35)
GFR calc Af Amer: 76 mL/min — ABNORMAL LOW (ref >90)
GFR calc non Af Amer: 65 mL/min — ABNORMAL LOW (ref >90)
GFR calc non Af Amer: 73 mL/min — ABNORMAL LOW (ref >90)
GFR, EST AFRICAN AMERICAN: 84 mL/min — AB (ref >90)
Glucose, Bld: 147 mg/dL — ABNORMAL HIGH (ref 70–99)
Glucose, Bld: 228 mg/dL — ABNORMAL HIGH (ref 70–99)
POTASSIUM: 3.7 meq/L (ref 3.7–5.3)
POTASSIUM: 3.9 meq/L (ref 3.7–5.3)
Sodium: 164 mEq/L (ref 137–147)
Sodium: 160 mEq/L — ABNORMAL HIGH (ref 137–147)

## 2013-11-20 LAB — TSH: TSH: 1.49 u[IU]/mL (ref 0.350–4.500)

## 2013-11-20 LAB — PROLACTIN: Prolactin: 7.1 ng/mL (ref 2.1–17.1)

## 2013-11-20 MED ORDER — DEXTROSE 5 % IV SOLN
INTRAVENOUS | Status: DC
  Administered 2013-11-20 – 2013-11-21 (×3): via INTRAVENOUS
  Filled 2013-11-20: qty 1000
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
## 2013-11-20 NOTE — Care Management Note (Signed)
    Page 1 of 1   11/22/2013     2:05:18 PM CARE MANAGEMENT NOTE 11/22/2013  Patient:  Arthur Bailey, Arthur Bailey   Account Number:  [id number]  Date Initiated:  11/20/2013  Documentation initiated by:  GRAVES-BIGELOW,BRENDA  Subjective/Objective Assessment:   Pt admitted for Acute encephalopathy. Pt is from [facility]     Action/Plan:   CM to continue to monitor for dispositon needs.   Anticipated DC Date:  11/22/2013   Anticipated DC Plan:  SKILLED NURSING FACILITY      DC Planning Services  CM consult      Choice offered to / List presented to:             Status of service:  Completed, signed off Medicare Important Message given?  YES (If response is "NO", the following Medicare IM given date fields will be blank) Date Medicare IM given:  11/20/2013 Medicare IM given by:  GRAVES-BIGELOW,BRENDA Date Additional Medicare IM given:   Additional Medicare IM given by:    Discharge Disposition:  SKILLED NURSING FACILITY  Per UR Regulation:  Reviewed for med. necessity/level of care/duration of stay  If discussed at Long Length of Stay Meetings, dates discussed:    Comments:

## 2013-11-20 NOTE — Progress Notes (Signed)
Clinical Social Work Department BRIEF PSYCHOSOCIAL ASSESSMENT 11/20/2013  Patient:  Arthur Bailey, Arthur Bailey     Account Number:  [id number]     Admit date:  11/17/2013  Clinical Social Worker:  Harless Nakayama  Date/Time:  11/20/2013 03:45 PM  Referred by:  Physician  Date Referred:  11/20/2013 Referred for  SNF Placement   Other Referral:   Interview type:  Family Other interview type:   Spoke with pt friend and POA [employer] forces logistics/support/administrative officer    PSYCHOSOCIAL DATA Living Status:  FACILITY Admitted from facility:  [facility] Level of care:  Skilled Nursing Facility Primary support name:  Josie Dixon [facility]) Primary support relationship to patient:  FRIEND Degree of support available:   Pt has good support    CURRENT CONCERNS Current Concerns  Post-Acute Placement   Other Concerns:    SOCIAL WORK ASSESSMENT / PLAN CSW received call back from [employer]. CSW clarified relationship to pt, and Mr. Comer informed CSW he is pt friend and HCPOA. CSW introduced self and explained reason for phone call. Mr. Luciana Axe informed CSW that pt has been at [facility] for several weeks now, however they are awaiting placement at [facility] facility. Mr. Luciana Axe hoping CSW can asisst and have pt dc from hospital to [facility] facility. CSW explained that CSW would follow up with pt [facility] social worker but likely if they did not have bed for pt prior to admission they still may not have one. Mr. Luciana Axe understanding of this and informed CSW that plan would hopefully be for pt to return to [facility] if he cannot dc to [place] SNF. Mr. Luciana Axe happy with care ta facility and has no concerns. Mr. Luciana Axe did inform CSW that facility had already explained that CSW could assist with placement in [facility] if needed and so Mr. Comer is agreeable to CSW sending referral to alternative [place] facilities if [facility] cannot accept pt back. CSW still awaiting return phone call from facility to clarify it pt can return.    Assessment/plan status:  Psychosocial Support/Ongoing Assessment of Needs Other assessment/ plan:   Information/referral to community resources:   SNF list to be provided if needed    PATIENT'S/FAMILY'S RESPONSE TO PLAN OF CARE: Pt HCPOA very pleasant and coopeartive. Mr. Luciana Axe agreeable to pt returning to current facility if no [facility] bed available.       Lamiya Naas, LCSWA [phone]

## 2013-11-20 NOTE — Clinical Social Work Note (Signed)
  CSW is unable to talk to pt due to alter mental status. CSW  attempted to reach the pt's family. CSW left a voice mail for Joneen Boers (pt's niece) at [phone] and [phone] and Josie Dixon [facility]) at [phone] or [phone]. CSW will follow up with the family.   Sharry Beining, MSW, LCSWA [phone]

## 2013-11-20 NOTE — Progress Notes (Signed)
CRITICAL VALUE ALERT  Critical value received:  Na 164  Date of notification:  11/20/2013  Time of notification:  0622  Critical value read back: Yes  Nurse who received alert:  Modena Nunnery RN  MD notified (1st page):  Kirtland Bouchard Schorr  Time of first page:  0629  MD notified (2nd page): Kirtland Bouchard Schorr  Time of second page: [phone]  Responding MD:  Awaiting response.  Ensured Day shift RN had critical lab and paged day shift MD.
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
## 2013-11-21 LAB — BASIC METABOLIC PANEL
ANION GAP: 10 (ref 5–15)
ANION GAP: 12 (ref 5–15)
BUN: 21 mg/dL (ref 6–23)
BUN: 15 mg/dL (ref 6–23)
CALCIUM: 9 mg/dL (ref 8.4–10.5)
CALCIUM: 8.3 mg/dL — AB (ref 8.4–10.5)
CO2: 26 mEq/L (ref 19–32)
CO2: 23 mEq/L (ref 19–32)
Chloride: 119 mEq/L — ABNORMAL HIGH (ref 96–112)
Chloride: 114 mEq/L — ABNORMAL HIGH (ref 96–112)
Creatinine, Ser: 0.87 mg/dL (ref 0.50–1.35)
Creatinine, Ser: 0.77 mg/dL (ref 0.50–1.35)
GFR calc Af Amer: >90 mL/min (ref >90)
GFR, EST NON AFRICAN AMERICAN: 84 mL/min — AB (ref >90)
GFR, EST NON AFRICAN AMERICAN: 88 mL/min — AB (ref >90)
GLUCOSE: 240 mg/dL — AB (ref 70–99)
Glucose, Bld: 267 mg/dL — ABNORMAL HIGH (ref 70–99)
POTASSIUM: 3.4 meq/L — AB (ref 3.7–5.3)
Potassium: 3.1 mEq/L — ABNORMAL LOW (ref 3.7–5.3)
SODIUM: 155 meq/L — AB (ref 137–147)
SODIUM: 149 meq/L — AB (ref 137–147)

## 2013-11-21 LAB — GLUCOSE, CAPILLARY
GLUCOSE-CAPILLARY: 195 mg/dL — AB (ref 70–99)
GLUCOSE-CAPILLARY: 221 mg/dL — AB (ref 70–99)
Glucose-Capillary: 259 mg/dL — ABNORMAL HIGH (ref 70–99)
Glucose-Capillary: 316 mg/dL — ABNORMAL HIGH (ref 70–99)

## 2013-11-21 LAB — URINE CULTURE
COLONY COUNT: NO GROWTH
CULTURE: NO GROWTH
Special Requests: NORMAL

## 2013-11-21 LAB — CBC
HCT: 34.3 % — ABNORMAL LOW (ref 39.0–52.0)
Hemoglobin: 10.7 g/dL — ABNORMAL LOW (ref 13.0–17.0)
MCH: 26.1 pg (ref 26.0–34.0)
MCHC: 31.2 g/dL (ref 30.0–36.0)
MCV: 83.7 fL (ref 78.0–100.0)
Platelets: 166 10*3/uL (ref 150–400)
RBC: 4.1 MIL/uL — AB (ref 4.22–5.81)
RDW: 14.8 % (ref 11.5–15.5)
WBC: 7.6 10*3/uL (ref 4.0–10.5)

## 2013-11-21 LAB — INSULIN-LIKE GROWTH FACTOR: Somatomedin C: 17 ng/mL — ABNORMAL LOW (ref 35–196)

## 2013-11-21 LAB — CORTISOL: Cortisol, Plasma: 17.6 ug/dL

## 2013-11-21 MED ORDER — POTASSIUM CHLORIDE 10 MEQ/100ML IV SOLN
10.0000 meq | INTRAVENOUS | Status: AC
  Administered 2013-11-21 (×3): 10 meq via INTRAVENOUS
  Filled 2013-11-21 (×3): qty 100

## 2013-11-21 MED ORDER — POTASSIUM CHLORIDE CRYS ER 20 MEQ PO TBCR
60.0000 meq | EXTENDED_RELEASE_TABLET | Freq: Once | ORAL | Status: DC
  Filled 2013-11-21: qty 3

## 2013-11-21 NOTE — Progress Notes (Signed)
Inpatient Diabetes Program Recommendations  AACE/ADA: New Consensus Statement on Inpatient Glycemic Control (2013)  Target Ranges:  Prepandial:   less than 140 mg/dL      Peak postprandial:   less than 180 mg/dL (1-2 hours)      Critically ill patients:  140 - 180 mg/dL  Results for Arthur Bailey, Arthur Bailey (MRN [id number]) as of 11/21/2013 13:24  Ref. Range 11/20/2013 07:51 11/20/2013 11:45 11/20/2013 17:08 11/21/2013 07:42 11/21/2013 11:49  Glucose-Capillary Latest Range: 70-99 mg/dL 409 (H) 811 (H) 914 (H) 195 (H) 221 (H)   Inpatient Diabetes Program Recommendations Correction (SSI): please add Novolog sensitive scale TID Thank you  Piedad Climes BSN, RN,CDE Inpatient Diabetes Coordinator [phone] (team pager)

## 2013-11-21 NOTE — Progress Notes (Addendum)
Clinical Social Work Department CLINICAL SOCIAL WORK PLACEMENT NOTE 11/21/2013  Patient:  Arthur Bailey, Arthur Bailey  Account Number:  [id number] Admit date:  11/17/2013  Clinical Social Worker:  Harless Nakayama  Date/time:  11/21/2013 03:22 PM  Clinical Social Work is seeking post-discharge placement for this patient at the following level of care:   SKILLED NURSING   (*CSW will update this form in Epic as items are completed)   11/21/2013  Patient/family provided with [facility] Department of Clinical Social Work's list of facilities offering this level of care within the geographic area requested by the patient (or if unable, by the patient's family).  11/21/2013  Patient/family informed of their freedom to choose among providers that offer the needed level of care, that participate in Medicare, Medicaid or managed care program needed by the patient, have an available bed and are willing to accept the patient.  11/21/2013  Patient/family informed of [facility]' ownership interest in [facility], as well as of the fact that they are under no obligation to receive care at this facility.  PASARR submitted to EDS on Existing PASARR number received on   FL2 transmitted to all facilities in geographic area requested by pt/family on  11/21/2013 FL2 transmitted to all facilities within larger geographic area on   Patient informed that his/her managed care company has contracts with or will negotiate with  certain facilities, including the following:     Patient/family informed of bed offers received:  11/22/2013 Patient chooses bed at [facility] Physician recommends and patient chooses bed at    Patient to be transferred to [facility] on  11/22/2013 Patient to be transferred to facility by [facility] Patient and family notified of transfer on 11/22/2013 Name of family member notified:  Josie Dixon  The following physician request were  entered in Epic: Physician Request  Please sign FL2.    Additional CommentsSharol Harness, LCSWA [phone]

## 2013-11-21 NOTE — Progress Notes (Signed)
CSW (Clinical Child psychotherapist) received message from [facility] informing that pt HCPOA is not holding a bed at the facility and they are not sure if they will be able to accept pt back at dc. CSW faxed pt clinicals to other [facility] facilities as was discussed with pt HCPOA yesterday. CSW also called the [place] and spoke with Kirkland Hun who was covering for [employer] [phone]. She informed CSW pt is currently being screened and requested that CSW fax additional information. CSW confirmed fax number and sent to screening social worker. Will await return call from [place] with more information on pt being admitted to [facility] SNF and will present HCPOA with bed offers when available.  Shaka Zech, LCSWA [phone]
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
## 2013-11-22 LAB — GLUCOSE, CAPILLARY
Glucose-Capillary: 232 mg/dL — ABNORMAL HIGH (ref 70–99)
Glucose-Capillary: 288 mg/dL — ABNORMAL HIGH (ref 70–99)

## 2013-11-22 LAB — BASIC METABOLIC PANEL
Anion gap: 12 (ref 5–15)
BUN: 9 mg/dL (ref 6–23)
CO2: 23 mEq/L (ref 19–32)
CREATININE: 0.69 mg/dL (ref 0.50–1.35)
Calcium: 8.6 mg/dL (ref 8.4–10.5)
Chloride: 111 mEq/L (ref 96–112)
Glucose, Bld: 230 mg/dL — ABNORMAL HIGH (ref 70–99)
Potassium: 3.5 mEq/L — ABNORMAL LOW (ref 3.7–5.3)
Sodium: 146 mEq/L (ref 137–147)

## 2013-11-22 MED ORDER — CIPROFLOXACIN HCL 500 MG PO TABS: 500.0000 mg | ORAL_TABLET | Freq: Two times a day (BID) | ORAL | Status: DC

## 2013-11-22 MED ORDER — POTASSIUM CHLORIDE CRYS ER 20 MEQ PO TBCR
40.0000 meq | EXTENDED_RELEASE_TABLET | Freq: Two times a day (BID) | ORAL | Status: DC
  Administered 2013-11-22: 40 meq via ORAL
  Filled 2013-11-22: qty 2

## 2013-11-22 MED ORDER — CIPROFLOXACIN HCL 500 MG PO TABS
500.0000 mg | ORAL_TABLET | Freq: Two times a day (BID) | ORAL | Status: DC
  Filled 2013-11-22 (×3): qty 1
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
## 2013-11-22 NOTE — Discharge Instructions (Signed)
STROKE/TIA DISCHARGE INSTRUCTIONS SMOKING Cigarette smoking nearly doubles your risk of having a stroke & is the single most alterable risk factor  If you smoke or have smoked in the last 12 months, you are advised to quit smoking for your health.  Most of the excess cardiovascular risk related to smoking disappears within a year of stopping.  Ask you doctor about anti-smoking medications  [place] Quit Line: 1-800-QUIT NOW  Free Smoking Cessation Classes [phone]  CHOLESTEROL Know your levels; limit fat & cholesterol in your diet  Lipid Panel  No results found for this basename: chol, trig, hdl, cholhdl, vldl, ldlcalc      Many patients benefit from treatment even if their cholesterol is at goal.  Goal: Total Cholesterol (CHOL) less than 160  Goal:  Triglycerides (TRIG) less than 150  Goal:  HDL greater than 40  Goal:  LDL (LDLCALC) less than 100   BLOOD PRESSURE American Stroke Association blood pressure target is less that 120/80 mm/Hg  Your discharge blood pressure is:  BP: 101/46 mmHg  Monitor your blood pressure  Limit your salt and alcohol intake  Many individuals will require more than one medication for high blood pressure  DIABETES (A1c is a blood sugar average for last 3 months) Goal HGBA1c is under 7% (HBGA1c is blood sugar average for last 3 months)  Diabetes: No known diagnosis of diabetes    No results found for this basename: HGBA1C     Your HGBA1c can be lowered with medications, healthy diet, and exercise.  Check your blood sugar as directed by your physician  Call your physician if you experience unexplained or low blood sugars.  PHYSICAL ACTIVITY/REHABILITATION Goal is 30 minutes at least 4 days per week  Activity: Increase activity slowly, Therapies: Physical Therapy: Nursing Facility Return to work: N/A  Activity decreases your risk of heart attack and stroke and makes your heart stronger.  It helps control your weight and blood pressure; helps  you relax and can improve your mood.  Participate in a regular exercise program.  Talk with your doctor about the best form of exercise for you (dancing, walking, swimming, cycling).  DIET/WEIGHT Goal is to maintain a healthy weight  Your discharge diet is: Dysphagia thin liquids Your height is:  Height:  (180.3 cm) Your current weight is: Weight: 50.032 kg (110 lb 4.8 oz) Your Body Mass Index (BMI) is:  BMI (Calculated): 14.1  Following the type of diet specifically designed for you will help prevent another stroke.  Your goal weight range is:  135-185  Your goal Body Mass Index (BMI) is 19-24.  Healthy food habits can help reduce 3 risk factors for stroke:  High cholesterol, hypertension, and excess weight.  RESOURCES Stroke/Support Group:  Call [phone]   STROKE EDUCATION PROVIDED/REVIEWED AND GIVEN TO PATIENT Stroke warning signs and symptoms How to activate emergency medical system (call 911). Medications prescribed at discharge. Need for follow-up after discharge. Personal risk factors for stroke. Pneumonia vaccine given: No Flu vaccine given: No My questions have been answered, the writing is legible, and I understand these instructions.  I will adhere to these goals & educational materials that have been provided to me after my discharge from the hospital.

## 2013-11-22 NOTE — Progress Notes (Signed)
CSW (Clinical Social Worker) updated [employer] on pt dc today. They are needing some additional information from pt friend/HCPOA. CSW called Leonette Most and he informed CSW he has not completed a Medicaid application yet. CSW notified Leonette Most that [facility] could not accept pt back without this application and pt would need to dc to alternate facility today. CSW provided Leonette Most with bed offers and he accepted bed at [facility]. CSW text paged MD to notify. CSW also notified facility of dc today.  Lorayne Getchell, LCSWA [phone]

## 2013-11-22 NOTE — Progress Notes (Signed)
CSW (Clinical Social Worker) prepared pt dc packet and placed with shadow chart. CSW arranged non-emergent ambulance transport. Pt family, pt nurse, and facility informed. CSW signing off.  Xyla Leisner, LCSWA [phone]  

## 2013-11-23 LAB — GROWTH HORMONE: GROWTH HORMONE: 0.94 ng/mL (ref 0.00–3.00)

## 2013-11-24 ENCOUNTER — Emergency Department (HOSPITAL_COMMUNITY)
Admission: EM | Admit: 2013-11-24 | Discharge: 2013-11-24 | Disposition: A | Discharge status: Skilled Nursing Facility | Payer: Medicare Other | Attending: Emergency Medicine | Admitting: Emergency Medicine

## 2013-11-24 ENCOUNTER — Non-Acute Institutional Stay (SKILLED_NURSING_FACILITY): Payer: Self-pay | Admitting: Internal Medicine

## 2013-11-24 ENCOUNTER — Other Ambulatory Visit: Payer: Self-pay | Admitting: *Deleted

## 2013-11-24 ENCOUNTER — Emergency Department (HOSPITAL_COMMUNITY): Payer: Medicare Other

## 2013-11-24 ENCOUNTER — Encounter (HOSPITAL_COMMUNITY): Payer: Self-pay | Admitting: Emergency Medicine

## 2013-11-24 DIAGNOSIS — R400 Somnolence: Secondary | ICD-10-CM

## 2013-11-24 DIAGNOSIS — S065X9A Traumatic subdural hemorrhage with loss of consciousness of unspecified duration, initial encounter: Secondary | ICD-10-CM

## 2013-11-24 DIAGNOSIS — S065XAA Traumatic subdural hemorrhage with loss of consciousness status unknown, initial encounter: Secondary | ICD-10-CM

## 2013-11-24 DIAGNOSIS — R112 Nausea with vomiting, unspecified: Secondary | ICD-10-CM

## 2013-11-24 DIAGNOSIS — I10 Essential (primary) hypertension: Secondary | ICD-10-CM | POA: Insufficient documentation

## 2013-11-24 DIAGNOSIS — E876 Hypokalemia: Secondary | ICD-10-CM

## 2013-11-24 DIAGNOSIS — I6200 Nontraumatic subdural hemorrhage, unspecified: Secondary | ICD-10-CM

## 2013-11-24 DIAGNOSIS — E119 Type 2 diabetes mellitus without complications: Secondary | ICD-10-CM | POA: Insufficient documentation

## 2013-11-24 DIAGNOSIS — R110 Nausea: Secondary | ICD-10-CM

## 2013-11-24 DIAGNOSIS — Z79899 Other long term (current) drug therapy: Secondary | ICD-10-CM | POA: Insufficient documentation

## 2013-11-24 DIAGNOSIS — Z8669 Personal history of other diseases of the nervous system and sense organs: Secondary | ICD-10-CM | POA: Insufficient documentation

## 2013-11-24 DIAGNOSIS — K920 Hematemesis: Secondary | ICD-10-CM

## 2013-11-24 DIAGNOSIS — F17200 Nicotine dependence, unspecified, uncomplicated: Secondary | ICD-10-CM | POA: Insufficient documentation

## 2013-11-24 DIAGNOSIS — Z9889 Other specified postprocedural states: Secondary | ICD-10-CM | POA: Insufficient documentation

## 2013-11-24 DIAGNOSIS — R404 Transient alteration of awareness: Secondary | ICD-10-CM

## 2013-11-24 HISTORY — DX: Traumatic subdural hemorrhage with loss of consciousness status unknown, initial encounter: S06.5XAA

## 2013-11-24 HISTORY — DX: Traumatic subdural hemorrhage with loss of consciousness of unspecified duration, initial encounter: S06.5X9A

## 2013-11-24 LAB — COMPREHENSIVE METABOLIC PANEL
ALT: 78 U/L — AB (ref 0–53)
AST: 98 U/L — AB (ref 0–37)
Albumin: 3.1 g/dL — ABNORMAL LOW (ref 3.5–5.2)
Alkaline Phosphatase: 116 U/L (ref 39–117)
Anion gap: 18 — ABNORMAL HIGH (ref 5–15)
BUN: 14 mg/dL (ref 6–23)
CHLORIDE: 106 meq/L (ref 96–112)
CO2: 24 mEq/L (ref 19–32)
Calcium: 9.7 mg/dL (ref 8.4–10.5)
Creatinine, Ser: 0.75 mg/dL (ref 0.50–1.35)
GFR calc Af Amer: >90 mL/min (ref >90)
GFR calc non Af Amer: 89 mL/min — ABNORMAL LOW (ref >90)
Glucose, Bld: 174 mg/dL — ABNORMAL HIGH (ref 70–99)
Potassium: 2.9 mEq/L — CL (ref 3.7–5.3)
Sodium: 148 mEq/L — ABNORMAL HIGH (ref 137–147)
Total Bilirubin: 0.6 mg/dL (ref 0.3–1.2)
Total Protein: 8.6 g/dL — ABNORMAL HIGH (ref 6.0–8.3)

## 2013-11-24 LAB — PROTIME-INR
INR: 1.22 (ref 0.00–1.49)
Prothrombin Time: 15.4 seconds — ABNORMAL HIGH (ref 11.6–15.2)

## 2013-11-24 LAB — POC OCCULT BLOOD, ED: Fecal Occult Bld: NEGATIVE

## 2013-11-24 LAB — CBC WITH DIFFERENTIAL/PLATELET
BASOS ABS: 0 10*3/uL (ref 0.0–0.1)
Basophils Relative: 0 % (ref 0–1)
Eosinophils Absolute: 0.1 10*3/uL (ref 0.0–0.7)
Eosinophils Relative: 1 % (ref 0–5)
HEMATOCRIT: 38.4 % — AB (ref 39.0–52.0)
Hemoglobin: 12.3 g/dL — ABNORMAL LOW (ref 13.0–17.0)
LYMPHS ABS: 1.4 10*3/uL (ref 0.7–4.0)
LYMPHS PCT: 18 % (ref 12–46)
MCH: 26.2 pg (ref 26.0–34.0)
MCHC: 32 g/dL (ref 30.0–36.0)
MCV: 81.9 fL (ref 78.0–100.0)
MONO ABS: 0.4 10*3/uL (ref 0.1–1.0)
Monocytes Relative: 4 % (ref 3–12)
Neutro Abs: 6.1 10*3/uL (ref 1.7–7.7)
Neutrophils Relative %: 77 % (ref 43–77)
Platelets: 247 10*3/uL (ref 150–400)
RBC: 4.69 MIL/uL (ref 4.22–5.81)
RDW: 14.6 % (ref 11.5–15.5)
WBC: 8 10*3/uL (ref 4.0–10.5)

## 2013-11-24 LAB — TYPE AND SCREEN
ABO/RH(D): O POS
ANTIBODY SCREEN: NEGATIVE

## 2013-11-24 LAB — APTT: aPTT: 31 seconds (ref 24–37)

## 2013-11-24 LAB — ABO/RH: ABO/RH(D): O POS

## 2013-11-24 LAB — ACTH: C206 ACTH: 23 pg/mL (ref 6–50)

## 2013-11-24 MED ORDER — POTASSIUM CHLORIDE CRYS ER 20 MEQ PO TBCR
40.0000 meq | EXTENDED_RELEASE_TABLET | Freq: Once | ORAL | Status: AC
  Administered 2013-11-24: 40 meq via ORAL
  Filled 2013-11-24: qty 2

## 2013-11-24 MED ORDER — SODIUM CHLORIDE 0.9 % IV SOLN
1000.0000 mL | INTRAVENOUS | Status: DC
  Administered 2013-11-24: 1000 mL via INTRAVENOUS

## 2013-11-24 MED ORDER — MAGNESIUM CHLORIDE 64 MG PO TBEC
1.0000 | DELAYED_RELEASE_TABLET | Freq: Once | ORAL | Status: AC
  Administered 2013-11-24: 64 mg via ORAL
  Filled 2013-11-24: qty 1

## 2013-11-24 MED ORDER — OXYCODONE-ACETAMINOPHEN 5-325 MG PO TABS: ORAL_TABLET | ORAL | Status: DC

## 2013-11-24 MED ORDER — POTASSIUM CHLORIDE 10 MEQ/100ML IV SOLN
10.0000 meq | Freq: Once | INTRAVENOUS | Status: AC
  Administered 2013-11-24: 10 meq via INTRAVENOUS
  Filled 2013-11-24: qty 100

## 2013-11-24 MED ORDER — ONDANSETRON HCL 4 MG/2ML IJ SOLN
4.0000 mg | Freq: Once | INTRAMUSCULAR | Status: AC
  Administered 2013-11-24: 4 mg via INTRAVENOUS
  Filled 2013-11-24: qty 2

## 2013-11-24 NOTE — ED Notes (Signed)
[facility] contacted for transport back to [facility]

## 2013-11-24 NOTE — ED Notes (Signed)
Staff at [facility] report pt. To have vomited some coffe-ground dark emesis today.  EMS found him in his room there eating some lunch and in no distress.  He had no emesis en route to hospital, and he arrives here in no distress.  Pt. Has no recall of the event.  He is oriented to his situation, and answers questions about his situation lucidly with slow but clear speech.

## 2013-11-24 NOTE — ED Notes (Signed)
He is more awake and alert now; and has vomited only once, shortly after arrival to E.D.  He is loquacious, and is hyper religious in his conversing.

## 2013-11-24 NOTE — ED Notes (Signed)
His son, who states he is P.O.A. States he was phoned by nurses at [facility] and they told him he (pt.) was sent here "to get a head CT".  CT ordered by Dr. Lars Mage.  Pt. Remains awake, alert and in no distress and converses intelligibly with his son.

## 2013-11-24 NOTE — Telephone Encounter (Signed)
[facility] 

## 2013-11-24 NOTE — ED Notes (Signed)
Bed: RESB Expected date:  Expected time:  Means of arrival:  Comments: EMS-GI bleed

## 2013-11-24 NOTE — ED Provider Notes (Signed)
CSN: [id number]     Arrival date & time 11/24/13  1340 History   First MD Initiated Contact with Patient 11/24/13 1407     Chief Complaint  Patient presents with  . coffee ground emesis     HPI Patient was sent to the emergency room from his nursing home. The staff at the facility reported that he had an episode of coffee ground emesis today. They called 911. When EMS arrived, the patient was sitting in his room eating lunch. The patient had no complaints. The patient denies any abdominal pain. Denies any diarrhea. He denies any coughing or shortness of breath. He has not noticed any blood in his stool. 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 Diabetes mellitus   . Hypertension   . Neuropathy   . Subdural hematoma    Past Surgical History  Procedure Laterality Date  . Craniotomy Left 10/15/2013    Procedure: CRANIOTOMY HEMATOMA EVACUATION SUBDURAL;  Surgeon: Lisbeth Renshaw, MD;  Location: [facility] OR;  Service: Neurosurgery;  Laterality: Left;  . Craniotomy Left 10/23/2013    Procedure: LEFT CRANIOTOMY FOR HYGROMA EVACUATION;  Surgeon: Lisbeth Renshaw, MD;  Location: [facility] NEURO ORS;  Service: Neurosurgery;  Laterality: Left;   No family history on file. History  Substance Use Topics  . Smoking status: Current Every Day Smoker -- 0.50 packs/day    Types: Cigarettes  . Smokeless tobacco: Not on file  . Alcohol Use: 6.0 oz/week    3 Cans of beer, 7 Shots of liquor per week     Comment: 3/week, "1 rum and coke a day"    Review of Systems  All other systems reviewed and are negative.     Allergies  Garlic  Home Medications   Prior to Admission medications   Medication Sig Start Date End Date Taking? Authorizing Provider  Amino Acids-Protein Hydrolys (FEEDING SUPPLEMENT, PRO-STAT SUGAR FREE 64,) LIQD Take 30 mLs by mouth 2 (two) times daily.   Yes Historical Provider, MD   cholecalciferol (VITAMIN D) 1000 UNITS tablet Take 1,000 Units by mouth daily.   Yes Historical Provider, MD  ciprofloxacin (CIPRO) 500 MG tablet Take 500 mg by mouth 2 (two) times daily. 11/22/13  Yes Historical Provider, MD  insulin regular (NOVOLIN R,HUMULIN R) 100 units/mL injection Inject 5 Units into the skin 3 (three) times daily as needed for high blood sugar. Sliding scale   Yes Historical Provider, MD  levETIRAcetam (KEPPRA) 500 MG tablet Take 500 mg by mouth 2 (two) times daily.   Yes Historical Provider, MD  loratadine (CLARITIN) 10 MG tablet Take 10 mg by mouth daily with breakfast.   Yes Historical Provider, MD  metFORMIN (GLUCOPHAGE) 1000 MG tablet Take 1,000 mg by mouth 2 (two) times daily with a meal.   Yes Historical Provider, MD  ondansetron (ZOFRAN-ODT) 4 MG disintegrating tablet Take 4 mg by mouth every 6 (six) hours as needed for nausea or vomiting.   Yes Historical Provider, MD  oxyCODONE-acetaminophen (PERCOCET/ROXICET) 5-325 MG per tablet Take 1-2 tablets by mouth every 4 (four) hours as needed for moderate pain (and mild pain).   Yes Historical Provider, MD  tuberculin (APLISOL) 5 UNIT/0.1ML injection Inject 0.1 mLs into the skin See admin instructions. Inject 0.20ml intradermally every Tuesday evening and read on Friday evening. Testing until 09/18. 11/22/13  Yes Historical Provider, MD   There were no vitals taken for  this visit. Physical Exam  Nursing note and vitals reviewed. Constitutional: He appears well-developed and well-nourished. No distress.  HENT:  Head: Normocephalic and atraumatic.  Right Ear: External ear normal.  Left Ear: External ear normal.  Left-sided craniotomy scar  Eyes: Conjunctivae are normal. Right eye exhibits no discharge. Left eye exhibits no discharge. No scleral icterus.  Neck: Neck supple. No tracheal deviation present.  Cardiovascular: Normal rate, regular rhythm and intact distal pulses.   Pulmonary/Chest: Effort normal and breath sounds  normal. No stridor. No respiratory distress. He has no wheezes. He has no rales.  Abdominal: Soft. Bowel sounds are normal. He exhibits no distension. There is no tenderness. There is no rebound and no guarding.  Genitourinary:  No stool in the rectal vault, no blood noted  Musculoskeletal: He exhibits no edema and no tenderness.  Neurological: He is alert. He has normal strength. No cranial nerve deficit (no facial droop, extraocular movements intact, no slurred speech) or sensory deficit. He exhibits normal muscle tone. He displays no seizure activity. Coordination normal.  Skin: Skin is warm and dry. No rash noted.  Psychiatric: He has a normal mood and affect.    ED Course  Procedures (including critical care time) Labs Review Labs Reviewed  CBC WITH DIFFERENTIAL - Abnormal; Notable for the following:    Hemoglobin 12.3 (*)    HCT 38.4 (*)    All other components within normal limits  COMPREHENSIVE METABOLIC PANEL - Abnormal; Notable for the following:    Sodium 148 (*)    Potassium 2.9 (*)    Glucose, Bld 174 (*)    Total Protein 8.6 (*)    Albumin 3.1 (*)    AST 98 (*)    ALT 78 (*)    GFR calc non Af Amer 89 (*)    Anion gap 18 (*)    All other components within normal limits  PROTIME-INR - Abnormal; Notable for the following:    Prothrombin Time 15.4 (*)    All other components within normal limits  APTT  POC OCCULT BLOOD, ED  POC OCCULT BLOOD, ED  TYPE AND SCREEN  ABO/RH     Medications  0.9 %  sodium chloride infusion (1,000 mLs Intravenous New Bag/Given 11/24/13 1441)  potassium chloride SA (K-DUR,KLOR-CON) CR tablet 40 mEq (not administered)  magnesium chloride (SLOW-MAG) 64 MG SR tablet 64 mg (not administered)  potassium chloride 10 mEq in 100 mL IVPB (not administered)  ondansetron (ZOFRAN) injection 4 mg (4 mg Intravenous Given 11/24/13 1511)    MDM   Final diagnoses:  Hypokalemia  Non-intractable vomiting with nausea, vomiting of unspecified type     Patient was sent to the emergency room because of concerns hematemesis. The patient's hemoglobin is essentially normal. In fact is higher than previous previous hemoglobin results.  His stool is Hemoccult negative.  The patient incidentally is hypokalemic. Given IV and oral potassium here in the emergency room.  Patient has not had any further episodes of vomiting. He'll be discharged back to the nursing facility. I recommend rechecking his potassium level and I will dc him home on oral potassium supplements.   Linwood Dibbles, MD 11/24/13 [phone]

## 2013-11-24 NOTE — ED Notes (Signed)
He did have a small emesis shortly after arrival; but is was in appearance as normal greenyellow bile with no red or dark material noted.

## 2013-11-24 NOTE — ED Notes (Signed)
Per EMS-had fall which resulted in subdural hematoma on Tues-no coughing up coffee ground emesis

## 2013-11-24 NOTE — Discharge Instructions (Signed)

## 2013-12-04 ENCOUNTER — Other Ambulatory Visit: Payer: Self-pay | Admitting: *Deleted

## 2013-12-04 MED ORDER — LORAZEPAM 0.5 MG PO TABS: ORAL_TABLET | ORAL | Status: DC

## 2013-12-04 NOTE — Telephone Encounter (Signed)
[facility] 

## 2013-12-08 ENCOUNTER — Other Ambulatory Visit: Payer: Self-pay | Admitting: *Deleted

## 2013-12-08 MED ORDER — LORAZEPAM 0.5 MG PO TABS: ORAL_TABLET | ORAL | Status: AC

## 2013-12-08 MED ORDER — OXYCODONE-ACETAMINOPHEN 5-325 MG PO TABS: ORAL_TABLET | ORAL | Status: AC

## 2013-12-08 NOTE — Telephone Encounter (Signed)
[facility] 

## 2013-12-12 ENCOUNTER — Non-Acute Institutional Stay (SKILLED_NURSING_FACILITY): Payer: PRIVATE HEALTH INSURANCE | Admitting: Internal Medicine

## 2013-12-12 DIAGNOSIS — S065X9A Traumatic subdural hemorrhage with loss of consciousness of unspecified duration, initial encounter: Secondary | ICD-10-CM

## 2013-12-12 DIAGNOSIS — IMO0001 Reserved for inherently not codable concepts without codable children: Secondary | ICD-10-CM

## 2013-12-12 DIAGNOSIS — E870 Hyperosmolality and hypernatremia: Secondary | ICD-10-CM

## 2013-12-12 DIAGNOSIS — S065XAA Traumatic subdural hemorrhage with loss of consciousness status unknown, initial encounter: Secondary | ICD-10-CM

## 2013-12-12 DIAGNOSIS — E1165 Type 2 diabetes mellitus with hyperglycemia: Secondary | ICD-10-CM

## 2013-12-12 DIAGNOSIS — R569 Unspecified convulsions: Secondary | ICD-10-CM

## 2013-12-12 DIAGNOSIS — I6200 Nontraumatic subdural hemorrhage, unspecified: Secondary | ICD-10-CM

## 2013-12-14 NOTE — Progress Notes (Addendum)
Patient ID: Arthur Bailey, male   DOB: 08-11-1940, 73 y.o.   MRN: [id number]               HISTORY & PHYSICAL  DATE:  12/12/2013    FACILITY: [facility]    LEVEL OF CARE:   SNF   CHIEF COMPLAINT:  Transfer from one of the [place] facilities.    HISTORY OF PRESENT ILLNESS:  This patient was transferred to this facility.  I am not exactly sure why.  [facility] and [facility] may be the reason.    Looking through [facility], he was admitted to hospital from 11/17/2013 through 11/22/2013 at which point he was found to have hypernatremia.  He also had significant hypokalemia with a potassium of 2.9.    He had previously been in the hospital from 10/14/2013 through 10/30/2013 for subdural hematoma for which he had a drainage procedure.  On that occasion, he was apparently transferred from another nursing home with a sodium of 165.  After this, he was sent to one of the [facility] facilities, although I do not see that he was actually seen by our service.    LABORATORY DATA:  Last lab work I see on him was from 11/24/2013.    His sodium was 148, potassium 2.9, albumin 3.1.  He had an AST of 98, ALT of 78.  Alk phos at 116 was normal.  BUN 14, creatinine 0.75.     White count was 8, hemoglobin of 12.3.    PAST MEDICAL HISTORY/PROBLEM LIST:   Includes:    Type 2 diabetes.  On insulin and metformin.    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  On Keppra.  This may have been post neurosurgery on 10/30/2013.    Discharged from the hospital on 10/30/2013 after presenting driving erratically.  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    Hypernatremia.    Hypokalemia.    CURRENT MEDICATIONS:  Medication list is reviewed.    Ativan 0.4 q.12.    Zofran 4 mg q.6.    Oxycodone/acetaminophen 5/325, 1 p.o. q.4.    Ciprofloxacin 500 p.o. b.i.d., which was stopped on  12/05/2013.    Keppra 500 two times a day.    Metformin 1000 b.i.d.    NovoLog sliding scale.    Vitamin D3, 1000 U a day.    Claritin 10 mg a day.    Remeron 7.5 q.h.s.    SOCIAL HISTORY:  I have absolutely no information on this patient.    FAMILY HISTORY:  Not available from any current source.     REVIEW OF SYSTEMS:   HEENT:  Patient denies headache.   CHEST/RESPIRATORY:  No cough.  No sputum.   CARDIAC:   No chest pain.   GI:  No abdominal pain.    PHYSICAL EXAMINATION:   GENERAL APPEARANCE:  Cooperative man who was apparently admitted to this facility in the regular ward.  However, he started wandering and, therefore, is on the locked ward.   HEENT:   MOUTH/THROAT:   Oral exam without significant lesions.   CHEST/RESPIRATORY:  Clear air entry bilaterally.   CARDIOVASCULAR:  CARDIAC:  There is a left parasternal lift and a prolonged PMI impulse.  There is a soft systolic murmur that does not radiate.  He appears to be euvolemic.   GASTROINTESTINAL:  LIVER/SPLEEN/KIDNEYS:  No liver, no spleen.  No  tenderness.   CIRCULATION:   EDEMA/VARICOSITIES:  Extremities:  No edema is noted.   NEUROLOGICAL:    BALANCE/GAIT:  His balance seems normal.   SENSATION/STRENGTH:  No pronator drift.    DEEP TENDON REFLEXES:  His reflexes are brisk bilaterally.   PSYCHIATRIC:   MENTAL STATUS:   He is not orientated to the year, month, or place.  He does not remember how he got here.    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  Looking at [facility], he underwent craniotomies x2 to evacuate this.    Recent admission to hospital in early August with significant hypernatremia, hypokalemia.  He apparently came from another nursing home, although I do not see it in our records.  He was discharged to one of the [facility], although I do not see any notes on this patient from our service.    Type 2 diabetes.  At one point, this man was on  44 U twice daily of insulin.  He is only on a loose sliding scale now.  I will have to urgently look at his blood sugars.    History of hypernatremia during his last hospitalization.  This was felt to be secondary to dehydration.  Diabetes insipidus was essentially ruled out with a urine osmolality of over 700.    Hypokalemia.  Again, hopefully this was followed somewhere.    Allergic rhinitis.  On Claritin.     Seizure disorder.  On Keppra.  However, this may have been just prophylaxis after neurosurgery.  I do not know the history here.    There is a lot about this man that seems strange including the fact that he was discharged to one of our nursing facilities, yet does not seem to have a note from our service in the chart.  Presumably, he was transferred here due to wandering because he needed to be put in the locked ward or his connection to the [place]???  His last hospitalization was from 11/17/2013 through 11/22/2013 with the acute hypernatremia.  He does not appear to be dehydrated currently, although I will certainly check his lab work.

## 2013-12-15 ENCOUNTER — Non-Acute Institutional Stay (SKILLED_NURSING_FACILITY): Payer: PRIVATE HEALTH INSURANCE | Admitting: Internal Medicine

## 2013-12-15 DIAGNOSIS — IMO0001 Reserved for inherently not codable concepts without codable children: Secondary | ICD-10-CM

## 2013-12-15 DIAGNOSIS — E1165 Type 2 diabetes mellitus with hyperglycemia: Principal | ICD-10-CM

## 2013-12-16 NOTE — Progress Notes (Signed)
Patient ID: Arthur Bailey, male   DOB: [birth year], 73 y.o.   MRN: [id number]    Facility: [facility]  Chief Complaint  Patient presents with  . New Admit To SNF   Allergies  Allergen Reactions  . Garlic Nausea And Vomiting   HPI 73 y/o male patient is here for STR after hospital admission from 11/17/13- 11/22/13 with altered mental status thought to be from a mixture of hypoglycemia, hypernatremia, dehydration and infection (UTI) He has hx of SDH and is s/p craniotomy in July 2015  He has had 2 falls since admission to the facility 2 days back. At present his CNA and nurse notified that he vomitted and the vomitus is black in appearance. It is guaiac positive. He is seen in his room. He has altered mental state, responds to name call but is somnolent. He had complained of headache to staff this am prior to vomiting  ROS Unable to obtain ROS from patient No fever or chills reported Falls x 2 reported with one witnessed hitting his head   Past Medical History  Diagnosis Date  . Diabetes mellitus   . Hypertension   . Neuropathy   . Subdural hematoma    Past Surgical History  Procedure Laterality Date  . Craniotomy Left 10/15/2013    Procedure: CRANIOTOMY HEMATOMA EVACUATION SUBDURAL;  Surgeon: Lisbeth Renshaw, MD;  Location: [facility] OR;  Service: Neurosurgery;  Laterality: Left;  . Craniotomy Left 10/23/2013    Procedure: LEFT CRANIOTOMY FOR HYGROMA EVACUATION;  Surgeon: Lisbeth Renshaw, MD;  Location: [facility] NEURO ORS;  Service: Neurosurgery;  Laterality: Left;   Medication reviewed. See [facility]  Physical exam BP 160/74  Pulse 98  Temp(Src) 98 F (36.7 C)  Resp 18  Ht  (1.727 m)  Wt 108 lb (48.988 kg)  BMI 16.42 kg/m2  SpO2 94%  General- elderly male in no acute distress, somnolent Head- left craniotomy scar Eyes- no pallor, no icterus, PERRLA Neck- no lymphadenopathy Cardiovascular- normal s1,s2, no murmurs Respiratory- bilateral clear to  auscultation, no wheeze, no rhonchi, no crackles Abdomen- bowel sounds present, soft, non tender Musculoskeletal- not following commands unable to assess Neurological- somnolent, responds to name call   Assessment/plan  Altered mental state As per staff, worsening of his MS. I have not seen this patient before but since he appears somnolent, had complained of severe headache before vomiting guaiac positive vomitus and had a fall hitting his head yesterday, will send to ED to assess for midline shift with his hematoma to be the cause of his AMS. Will need a repeat imaging of his head. No witnessed seizure in the facility.  Hematemesis Vomitus was guaiac positive. With hx of subdural hematoma and recent fall along with headache, sending to ED for further stat workup for his hematemesis. Able to protect his airway at present. Call EMS and transfer to ED  SDH See above  Reviewed care plan with charge nurse and DON.

## 2013-12-19 NOTE — Progress Notes (Signed)
Patient ID: Encarnacion ChuJames E Snowball, male   DOB: Aug 04, 1940, 73 y.o.   MRN: [id number]               PROGRESS NOTE  DATE:  12/15/2013    [facility]    LEVEL OF CARE:   SNF   Acute Visit   CHIEF COMPLAINT:  Follow up blood sugars.    HISTORY OF PRESENT ILLNESS:  This is a patient whom I admitted to the facility earlier in the week.  He came to us from another nursing home.  He had a recent traumatic subdural hematoma, underwent surgery at [facility].    He also had an admission in early August with significant hypernatremia, hypokalemia.    At one point, he appears to have been on a large dose of insulin, although he came to us on only a loose sliding scale and metformin.  It would appear that his blood sugars have generally been satisfactory in the high 100s fasting.  He is in the mid 100s a.c. lunch, and later in the day has been higher.    LABORATORY DATA:   His lab work has not returned as of yet.    ASSESSMENT/PLAN:  Type 2 diabetes.  In spite of the fact that he was on a high dose of insulin at one point, he seems to be reasonably stable on metformin.  I will drop his CBGs down to b.i.d., reduce the sliding scale, and follow up on this next week.    CPT CODE: [id number]

## 2013-12-25 ENCOUNTER — Non-Acute Institutional Stay (SKILLED_NURSING_FACILITY): Payer: PRIVATE HEALTH INSURANCE | Admitting: Internal Medicine

## 2013-12-25 DIAGNOSIS — D649 Anemia, unspecified: Secondary | ICD-10-CM

## 2013-12-25 DIAGNOSIS — E876 Hypokalemia: Secondary | ICD-10-CM

## 2013-12-28 NOTE — Progress Notes (Signed)
Patient ID: Arthur Bailey, male   DOB: [birth year], 73 y.o.   MRN: [id number]           PROGRESS NOTE  DATE: 12/25/2013           [facility]  LEVEL OF CARE: SNF (31)  Acute Visit  CHIEF COMPLAINT:  Manage hypokalemia and anemia.    HISTORY OF PRESENT ILLNESS: I was requested by the staff to assess the patient regarding above problem(s):  HYPOKALEMIA:  On 12/13/2013:  Potassium level was 3.3.  He is not on potassium supplementation or a diuretic.  He is a poor historian due to dementia.  Staff do not report any muscle cramping.    ANEMIA:  On 12/13/2013:   Hemoglobin 8.8, MCV 83.  A prior hemoglobin level is not available.  Staff do not report melena or hematochezia.     PAST MEDICAL HISTORY : Reviewed.  No changes/see problem list  CURRENT MEDICATIONS: Reviewed per MAR/see medication list  REVIEW OF SYSTEMS:  Unobtainable due to dementia.     PHYSICAL EXAMINATION  VS: see VS section  GENERAL: no acute distress, normal body habitus NECK: supple, trachea midline, no neck masses, no thyroid tenderness, no thyromegaly RESPIRATORY: breathing is even & unlabored, BS CTAB CARDIAC: RRR, no murmur,no extra heart sounds, no edema GI: abdomen soft, normal BS, no masses, no tenderness, no hepatomegaly, no splenomegaly PSYCHIATRIC: the patient is alert, disoriented, affect & behavior appropriate  ASSESSMENT/PLAN:  Hypokalemia.  New problem.  Recheck.  Also, check magnesium level.    Anemia.  Hemoglobin appears to be stable.  We will monitor.        CPT CODE: 8657899308          Angela CoxGayani Y Cherae Marton, MD [facility] [phone]

## 2014-01-03 ENCOUNTER — Non-Acute Institutional Stay (SKILLED_NURSING_FACILITY): Payer: PRIVATE HEALTH INSURANCE | Admitting: Internal Medicine

## 2014-01-03 DIAGNOSIS — R569 Unspecified convulsions: Secondary | ICD-10-CM

## 2014-01-03 DIAGNOSIS — F329 Major depressive disorder, single episode, unspecified: Secondary | ICD-10-CM

## 2014-01-03 DIAGNOSIS — J3089 Other allergic rhinitis: Secondary | ICD-10-CM

## 2014-01-03 DIAGNOSIS — E119 Type 2 diabetes mellitus without complications: Secondary | ICD-10-CM

## 2014-01-03 DIAGNOSIS — F32A Depression, unspecified: Secondary | ICD-10-CM

## 2014-01-04 DIAGNOSIS — F329 Major depressive disorder, single episode, unspecified: Secondary | ICD-10-CM | POA: Insufficient documentation

## 2014-01-04 DIAGNOSIS — E119 Type 2 diabetes mellitus without complications: Secondary | ICD-10-CM | POA: Insufficient documentation

## 2014-01-04 DIAGNOSIS — F32A Depression, unspecified: Secondary | ICD-10-CM | POA: Insufficient documentation

## 2014-01-04 DIAGNOSIS — J309 Allergic rhinitis, unspecified: Secondary | ICD-10-CM | POA: Insufficient documentation

## 2014-01-04 NOTE — Progress Notes (Signed)
         PROGRESS NOTE  DATE: 01/03/2014  FACILITY: Nursing Home Location: [facility]  LEVEL OF CARE: SNF (31)  Routine Visit  CHIEF COMPLAINT:  Manage seizure disorder, allergic rhinitis and diabetes mellitus  HISTORY OF PRESENT ILLNESS:  REASSESSMENT OF ONGOING PROBLEM(S):  SEIZURE DISORDER: The patient's seizure disorder remains stable. No complications reported from the medications presently being used. Staff do not report any recent seizure activity.  ALLERGIC RHINITIS: Allergic rhinitis remains stable.  Patient denies ongoing symptoms such as runny nose sneezing or tearing. No complications reported from the current medication(s) being used.  DM:pt's DM remains stable.  Pt denies polyuria, polydipsia, polyphagia, changes in vision or hypoglycemic episodes.  No complications noted from the medication presently being used.  Last hemoglobin A1c is: Not available.  PAST MEDICAL HISTORY : Reviewed.  No changes/see problem list  CURRENT MEDICATIONS: Reviewed per MAR/see medication list  REVIEW OF SYSTEMS:  GENERAL: no change in appetite, no fatigue, no weight changes, no fever, chills or weakness RESPIRATORY: no cough, SOB, DOE, wheezing, hemoptysis CARDIAC: no chest pain, edema or palpitations GI: no abdominal pain, diarrhea, constipation, heart burn, nausea or vomiting  PHYSICAL EXAMINATION  VS:  See VS section  GENERAL: no acute distress, normal body habitus EYES: conjunctivae normal, sclerae normal, normal eye lids NECK: supple, trachea midline, no neck masses, no thyroid tenderness, no thyromegaly LYMPHATICS: no LAN in the neck, no supraclavicular LAN RESPIRATORY: breathing is even & unlabored, BS CTAB CARDIAC: RRR, no murmur,no extra heart sounds, no edema GI: abdomen soft, normal BS, no masses, no tenderness, no hepatomegaly, no splenomegaly PSYCHIATRIC: the patient is alert & oriented to person, affect & behavior  appropriate  LABS/RADIOLOGY: None  ASSESSMENT/PLAN:  Seizure disorder-well-controlled Allergic rhinitis-well-controlled Diabetes mellitus-check hemoglobin A1c Depression-continue remeron Check CBC and CMP  CPT CODE: [id number]  Angela CoxGayani Y Dasanayaka, MD [facility] [phone]

## 2014-01-15 ENCOUNTER — Non-Acute Institutional Stay (SKILLED_NURSING_FACILITY): Payer: PRIVATE HEALTH INSURANCE | Admitting: Internal Medicine

## 2014-01-15 DIAGNOSIS — E119 Type 2 diabetes mellitus without complications: Secondary | ICD-10-CM

## 2014-01-17 NOTE — Progress Notes (Signed)
Patient ID: Arthur Bailey, male   DOB: 04/19/1940, 73 y.o.   MRN: [id number]           PROGRESS NOTE  DATE: 01/15/2014        [facility]  LEVEL OF CARE: SNF (31)  Acute Visit  CHIEF COMPLAINT:  Manage diabetes mellitus.    HISTORY OF PRESENT ILLNESS: I was requested by the staff to assess the patient regarding above problem(s):  DM:pt's DM is unstable.  Pt denies polyuria, polydipsia, polyphagia, changes in vision or hypoglycemic episodes.  No complications noted from the medication presently being used.  Last hemoglobin A1c is:  On 01/10/2014:  Hemoglobin A1c 7.2.    PAST MEDICAL HISTORY : Reviewed.  No changes/see problem list  CURRENT MEDICATIONS: Reviewed per MAR/see medication list  PHYSICAL EXAMINATION  VS: see VS section  GENERAL: no acute distress, normal body habitus RESPIRATORY: breathing is even & unlabored, BS CTAB CARDIAC: RRR, no murmur,no extra heart sounds, no edema  ASSESSMENT/PLAN:  Diabetes mellitus.  Not well controlled; but given his dysfunctional status and oral intake, we will not tighten control.    CPT CODE: [id number]          Arthur CoxGayani Y Victorya Hillman, MD [facility] [phone]

## 2015-02-11 IMAGING — CR DG CHEST 1V PORT
1 series · 1 of 1 positions shown · non-contrast
Comparison: Two-view chest x-ray 09/20/2013.

CLINICAL DATA: Altered mental status.

EXAM:
PORTABLE CHEST - 1 VIEW

[AP]
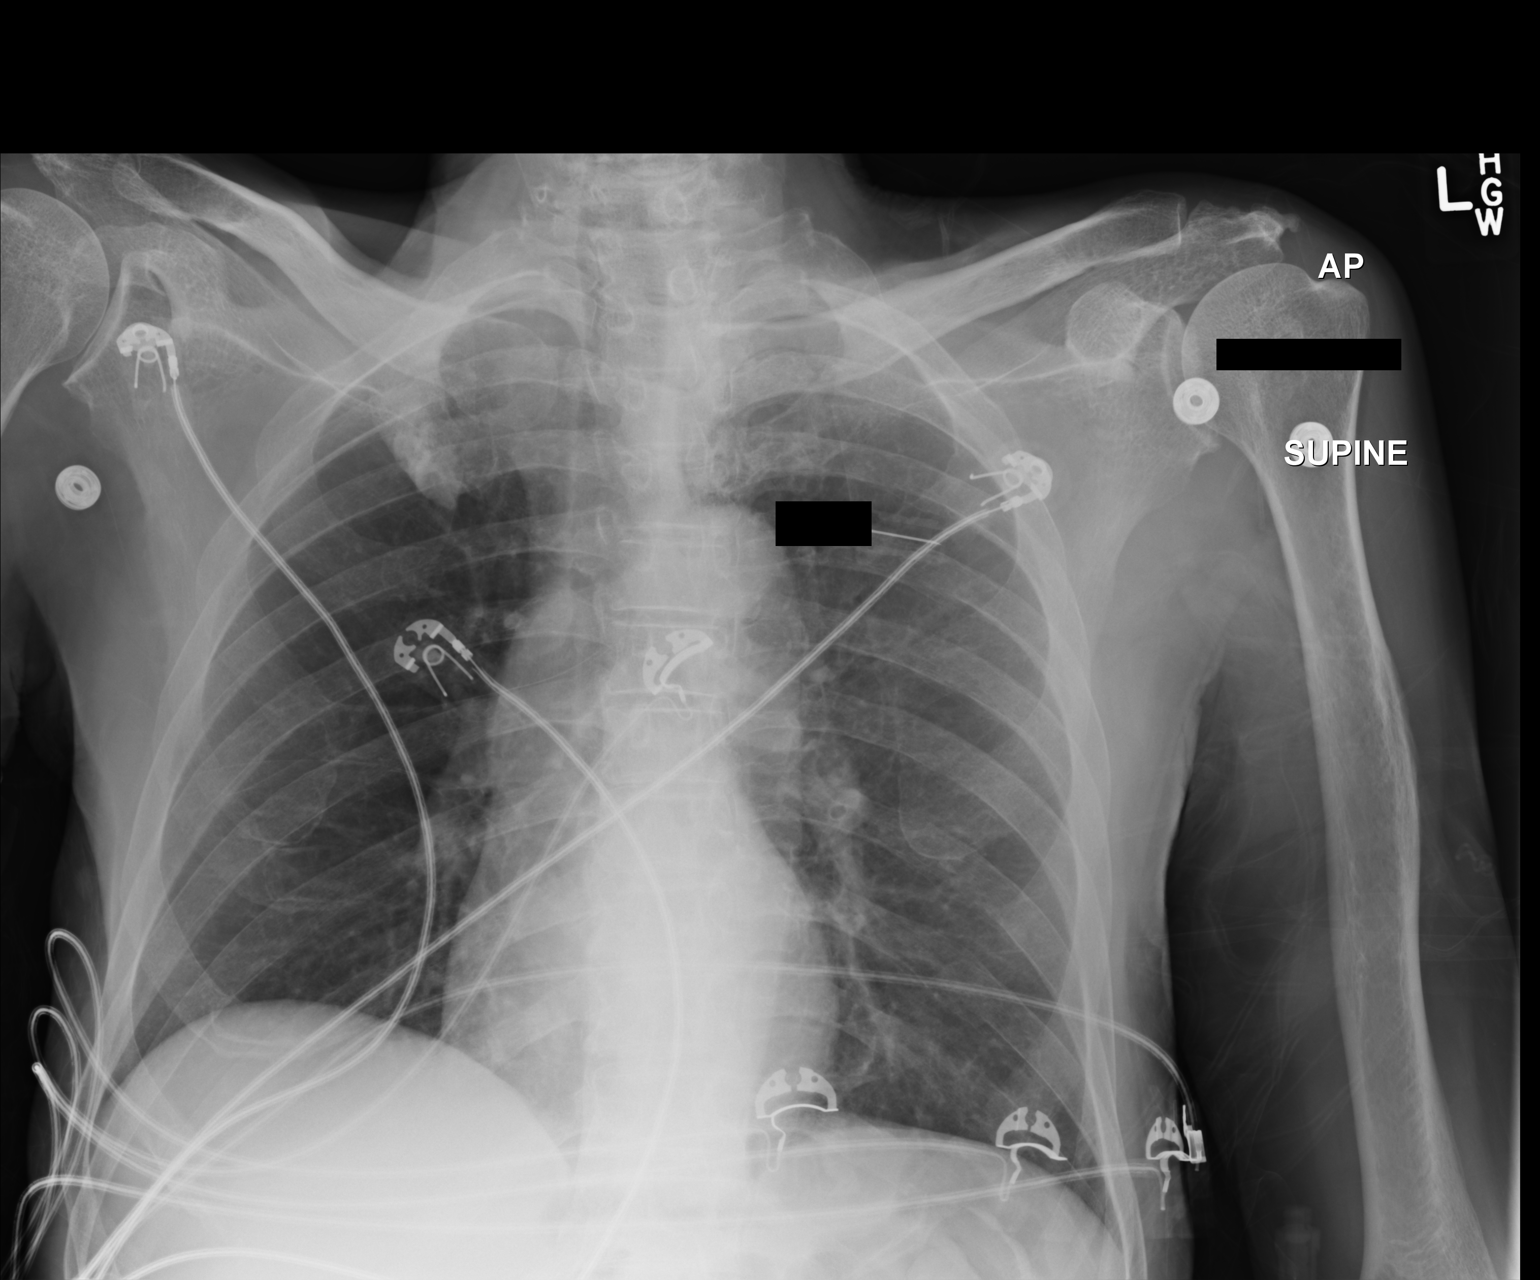

[1 of 1 positions shown; findings below may reference images not displayed]

FINDINGS: The heart size is normal. Biapical densities are new. The lungs are
otherwise clear. The visualized soft tissues and bony thorax are
unremarkable.
IMPRESSION: 1. New biapical densities. Given the timeframe, neoplasm is
considered less likely. Recommend PA and lateral chest radiograph
for further evaluation as the patient's condition allows.

## 2015-02-18 IMAGING — CT CT HEAD W/O CM
2 series · 17 of 30 positions shown, 20 images · non-contrast
Comparison: 11/17/2013

CLINICAL DATA: Left subdural hematoma, increased weakness, altered
mental status, vomiting

EXAM:
CT HEAD WITHOUT CONTRAST
TECHNIQUE: Contiguous axial images were obtained from the base of the skull
through the vertex without intravenous contrast.

[Series 2: head w/o · axial · non-contrast · 0.41mm/px · z∈[+1263,+1388]mm · 9 of 33 slices shown, 12 images]
[im 4/33  brain]
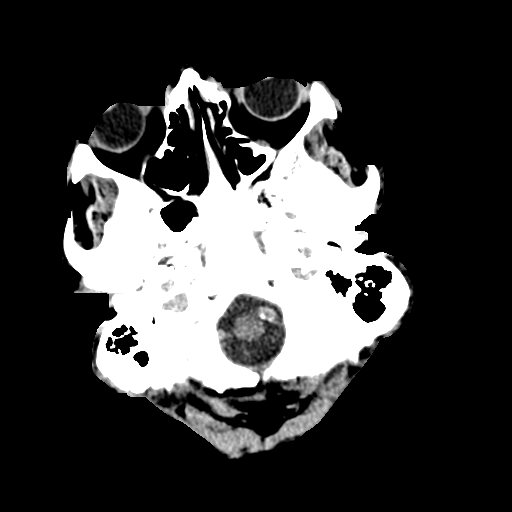
[im 4/33  bone]
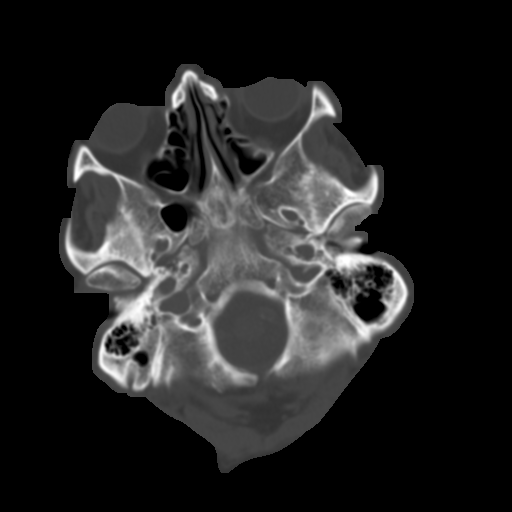
[im 7/33  brain]
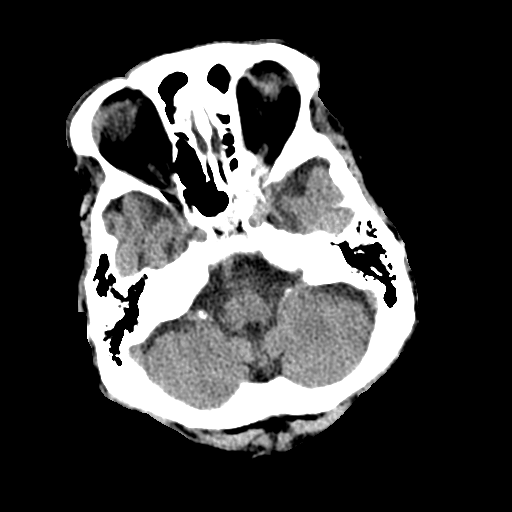
[im 10/33  brain]
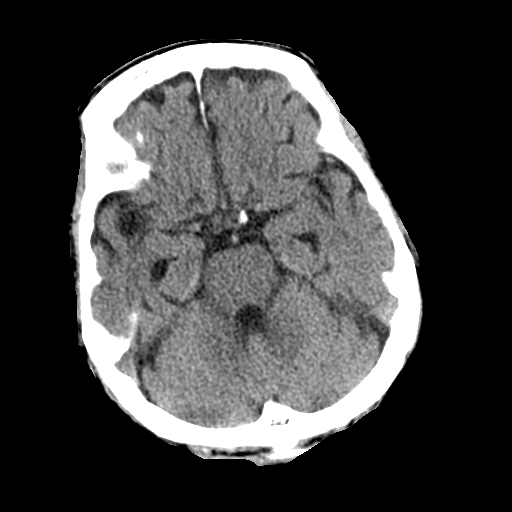
[im 13/33  brain]
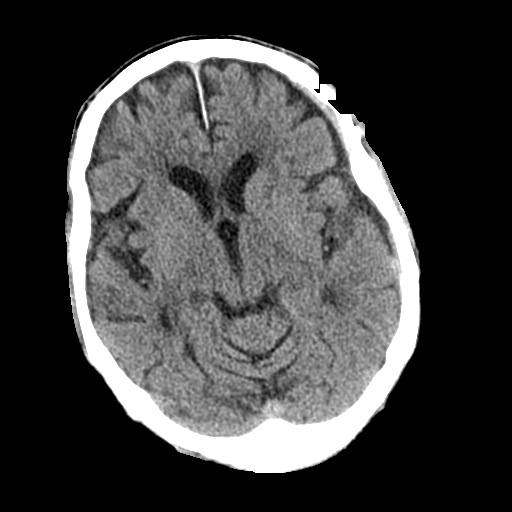
[im 17/33  brain]
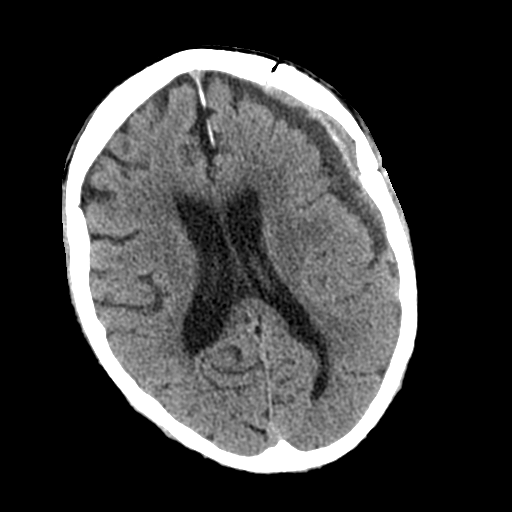
[im 17/33  bone]
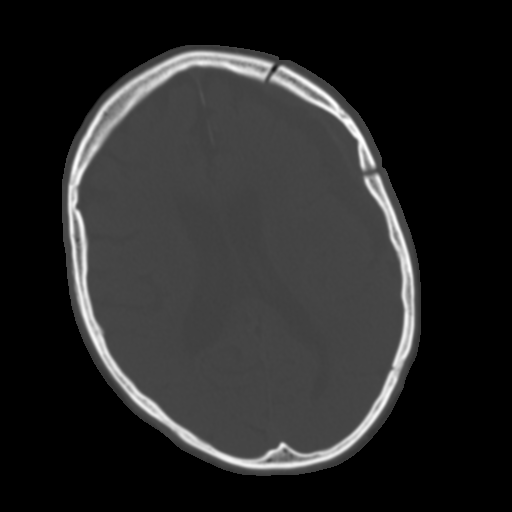
[im 20/33  brain]
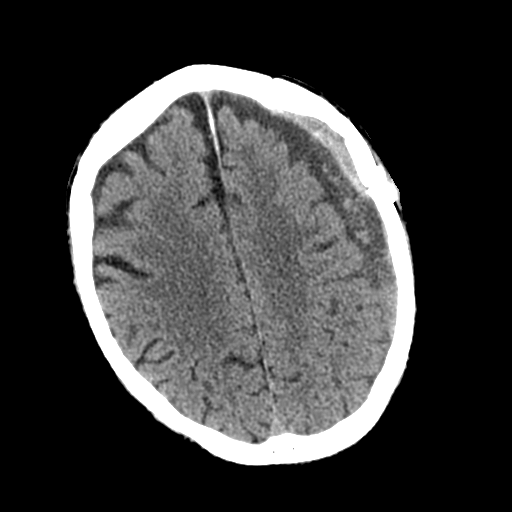
[im 23/33  brain]
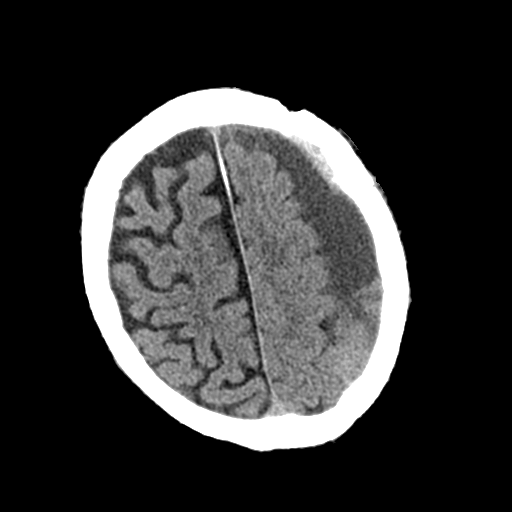
[im 26/33  brain]
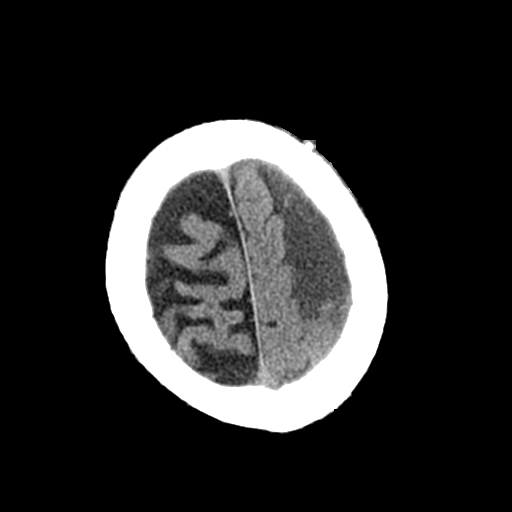
[im 29/33  brain]
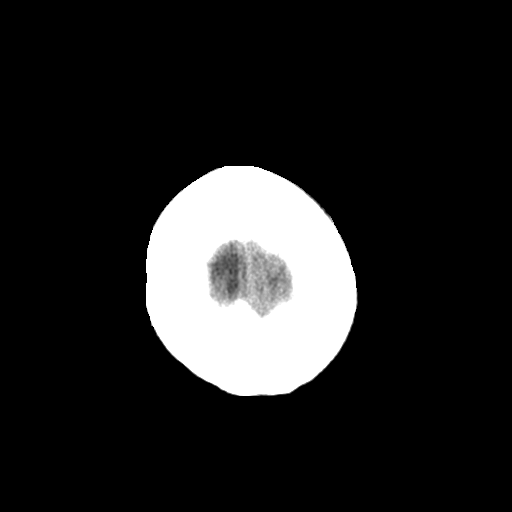
[im 29/33  bone]
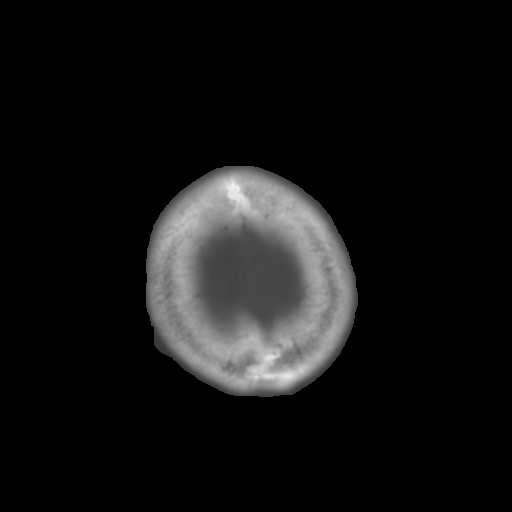

[Series 3: bone windows · axial · 0.41mm/px · z∈[+1263,+1389]mm · 8 of 54 slices shown]
[im 6/54  bone]
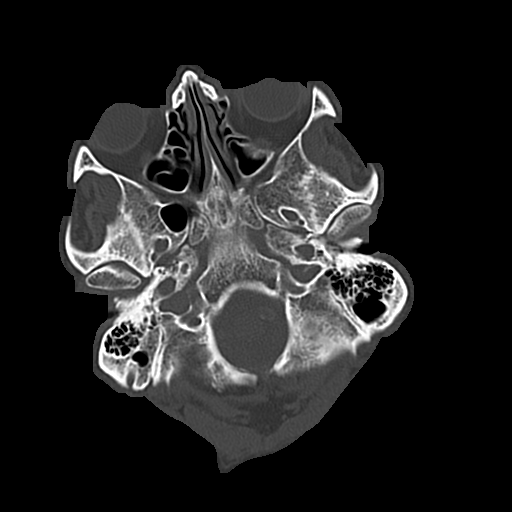
[im 12/54  bone]
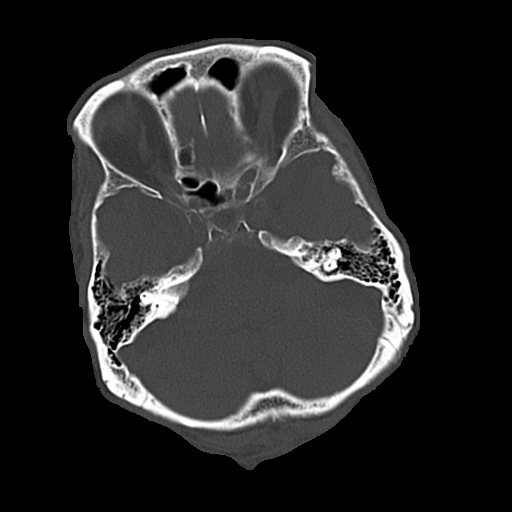
[im 18/54  bone]
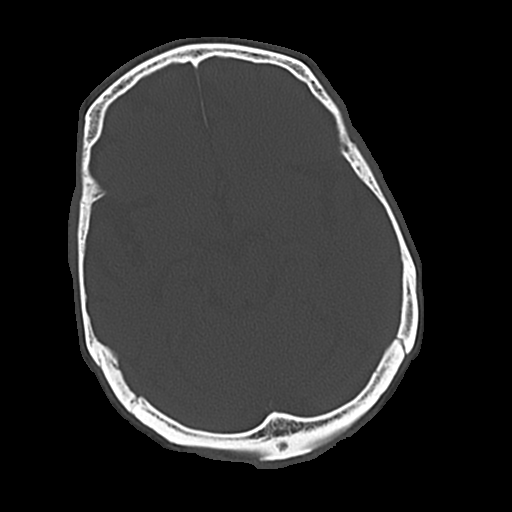
[im 24/54  bone]
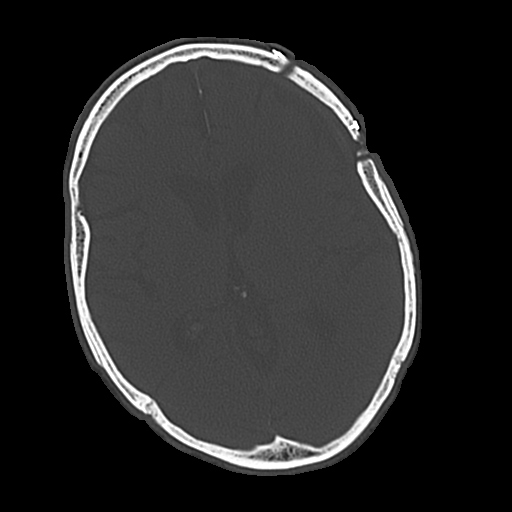
[im 30/54  bone]
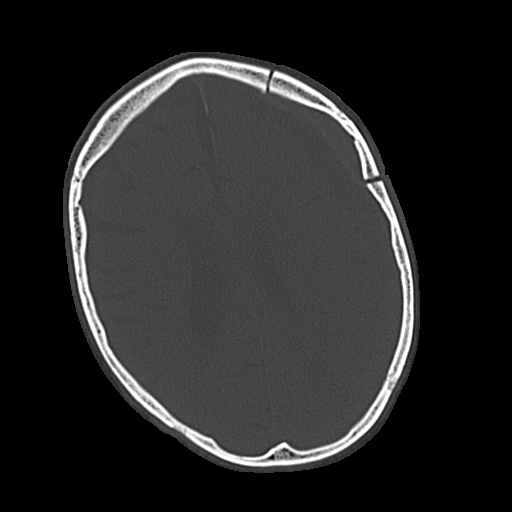
[im 36/54  bone]
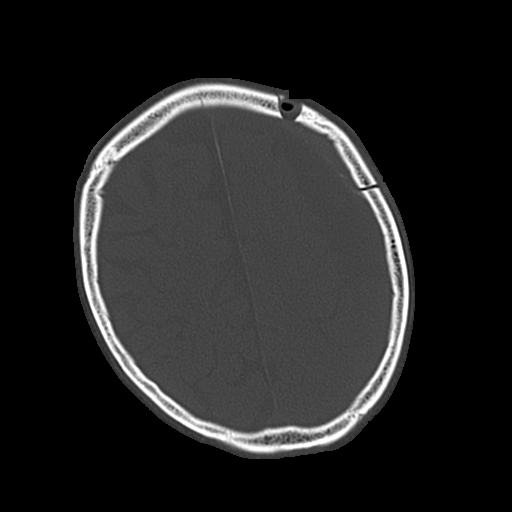
[im 42/54  bone]
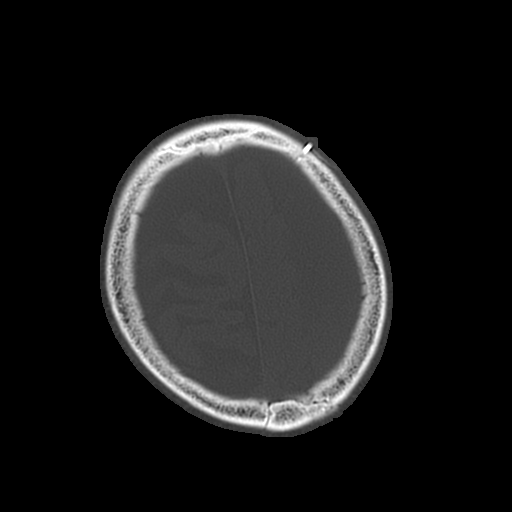
[im 48/54  bone]
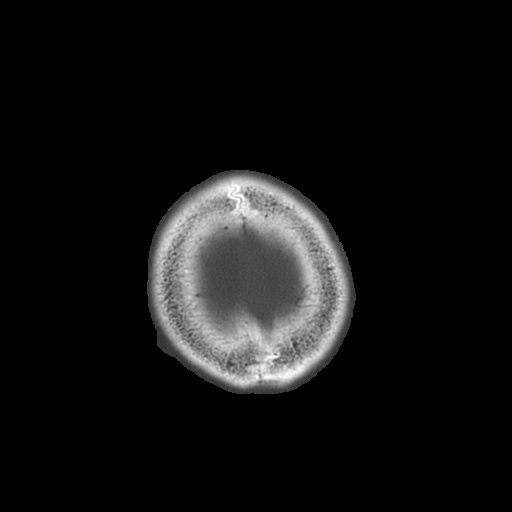

[17 of 30 positions shown; findings below may reference images not displayed]

FINDINGS: Continued evolution of the previously seen left frontoparietal
subdural hematoma is identified, slightly smaller than previously,
now measuring 1.9 cm image 23 compared to 2.2 cm maximally. Left
frontoparietal craniotomy defect reidentified. No midline shift.
Mild prominence of the ventricular diameter bilaterally is stable.
No new acute intracranial hemorrhage, mass, or infarct. Stable
degree of left frontoparietal sulcal effacement. Left maxillary
sinusitis reidentified.
IMPRESSION: Slight decrease in left subdural hematoma with continued interval
evolution. No new complicating feature such as midline shift.

## 2018-03-23 DEATH — deceased
